# Patient Record
Sex: Female | Born: 1975
Health system: Southern US, Community
[De-identification: ages and names within clinical notes are randomized; demographics above are authoritative.]

## PROBLEM LIST (undated history)

## (undated) DIAGNOSIS — J45909 Unspecified asthma, uncomplicated: Secondary | ICD-10-CM

## (undated) DIAGNOSIS — N189 Chronic kidney disease, unspecified: Secondary | ICD-10-CM

## (undated) DIAGNOSIS — R519 Headache, unspecified: Secondary | ICD-10-CM

## (undated) DIAGNOSIS — K219 Gastro-esophageal reflux disease without esophagitis: Secondary | ICD-10-CM

## (undated) DIAGNOSIS — F419 Anxiety disorder, unspecified: Secondary | ICD-10-CM

## (undated) DIAGNOSIS — J189 Pneumonia, unspecified organism: Secondary | ICD-10-CM

## (undated) DIAGNOSIS — N390 Urinary tract infection, site not specified: Secondary | ICD-10-CM

## (undated) DIAGNOSIS — M0600 Rheumatoid arthritis without rheumatoid factor, unspecified site: Secondary | ICD-10-CM

## (undated) DIAGNOSIS — I471 Supraventricular tachycardia, unspecified: Secondary | ICD-10-CM

## (undated) DIAGNOSIS — T7840XA Allergy, unspecified, initial encounter: Secondary | ICD-10-CM

## (undated) HISTORY — DX: Allergy, unspecified, initial encounter: T78.40XA

## (undated) HISTORY — DX: Rheumatoid arthritis without rheumatoid factor, unspecified site: M06.00

## (undated) HISTORY — DX: Unspecified asthma, uncomplicated: J45.909

## (undated) HISTORY — DX: Gastro-esophageal reflux disease without esophagitis: K21.9

## (undated) HISTORY — PX: HIP ARTHROPLASTY: SHX981

## (undated) HISTORY — DX: Headache, unspecified: R51.9

## (undated) HISTORY — DX: Urinary tract infection, site not specified: N39.0

## (undated) HISTORY — PX: SHOULDER ARTHROSCOPY: SHX128

## (undated) HISTORY — PX: OTHER SURGICAL HISTORY: SHX169

## (undated) HISTORY — PX: TONSILLECTOMY: SUR1361

---

## 2007-07-06 HISTORY — PX: DILATION AND CURETTAGE OF UTERUS: SHX78

## 2009-02-02 HISTORY — PX: INTRAUTERINE DEVICE (IUD) INSERTION: SHX5877

## 2013-09-24 ENCOUNTER — Encounter: Payer: Self-pay | Admitting: Gastroenterology

## 2013-10-29 HISTORY — PX: ESOPHAGOGASTRODUODENOSCOPY: SHX1529

## 2013-11-20 ENCOUNTER — Ambulatory Visit: Payer: Self-pay | Admitting: Gastroenterology

## 2019-02-19 DIAGNOSIS — J45909 Unspecified asthma, uncomplicated: Secondary | ICD-10-CM | POA: Diagnosis not present

## 2019-02-19 DIAGNOSIS — F411 Generalized anxiety disorder: Secondary | ICD-10-CM | POA: Diagnosis not present

## 2019-02-19 DIAGNOSIS — Z6824 Body mass index (BMI) 24.0-24.9, adult: Secondary | ICD-10-CM | POA: Diagnosis not present

## 2019-02-19 DIAGNOSIS — Z1331 Encounter for screening for depression: Secondary | ICD-10-CM | POA: Diagnosis not present

## 2019-02-19 DIAGNOSIS — Z0000 Encounter for general adult medical examination without abnormal findings: Secondary | ICD-10-CM | POA: Diagnosis not present

## 2019-02-19 DIAGNOSIS — M0609 Rheumatoid arthritis without rheumatoid factor, multiple sites: Secondary | ICD-10-CM | POA: Diagnosis not present

## 2019-02-19 DIAGNOSIS — J302 Other seasonal allergic rhinitis: Secondary | ICD-10-CM | POA: Diagnosis not present

## 2019-02-19 DIAGNOSIS — Z1389 Encounter for screening for other disorder: Secondary | ICD-10-CM | POA: Diagnosis not present

## 2019-03-01 ENCOUNTER — Other Ambulatory Visit: Payer: Self-pay | Admitting: Internal Medicine

## 2019-03-02 DIAGNOSIS — M0600 Rheumatoid arthritis without rheumatoid factor, unspecified site: Secondary | ICD-10-CM | POA: Diagnosis not present

## 2019-03-02 DIAGNOSIS — M2550 Pain in unspecified joint: Secondary | ICD-10-CM | POA: Diagnosis not present

## 2019-03-02 DIAGNOSIS — Z79899 Other long term (current) drug therapy: Secondary | ICD-10-CM | POA: Diagnosis not present

## 2019-03-05 ENCOUNTER — Ambulatory Visit (HOSPITAL_BASED_OUTPATIENT_CLINIC_OR_DEPARTMENT_OTHER): Payer: Self-pay | Admitting: Pharmacist

## 2019-03-05 ENCOUNTER — Other Ambulatory Visit: Payer: Self-pay

## 2019-03-05 DIAGNOSIS — Z7189 Other specified counseling: Secondary | ICD-10-CM

## 2019-03-05 MED ORDER — HUMIRA (2 PEN) 40 MG/0.4ML ~~LOC~~ AJKT: 40.0000 mg | AUTO-INJECTOR | SUBCUTANEOUS | 6 refills | Status: DC

## 2019-03-05 NOTE — Progress Notes (Signed)
  S: Patient presents for review of their specialty medication therapy.  Patient is currently taking Humira for RA. Patient is managed by Dr. Gavin Pound for this. She recently switched to [facility] and is still seeing a PCP in [place]. Since switching health systems, she has missed 1 dose of Humira.   Adherence: reports; never misses a dose with exception of last Sunday d/t insurance.  Efficacy: reports good results   Dosing:  Rheumatoid arthritis: SubQ: 40 mg every other week (may continue methotrexate, other nonbiologic DMARDS, corticosteroids, NSAIDs, and/or analgesics); patients not taking concomitant methotrexate may increase dose to 40 mg every week  Dose adjustments: Renal: no dose adjustments (has not been studied) Hepatic: no dose adjustments (has not been studied)  Drug-drug interactions: none  Screening: TB test: reevaluated 08/2018; negative  Hepatitis: negative per patient  Monitoring: S/sx of infection: none  CBC:  Reevaluated 2 weeks ago by PCP ([place] [facility]) - wnl S/sx of hypersensitivity: some redness, urticaria; ~8mm spot that resolves in 2 days  S/sx of malignancy: none  S/sx of heart failure: none   Other side effects: none reported   O:  No results found for: WBC, HGB, HCT, MCV, PLT    Chemistry   No results found for: NA, K, CL, CO2, BUN, CREATININE, GLU No results found for: CALCIUM, ALKPHOS, AST, ALT, BILITOT     A/P: 1. Medication review: Patient currently on Humira for RA; she reports being on this medication previous to this encounter. Reviewed the medication with the patient, including the following: Humira is a TNF blocking agent indicated for ankylosing spondylitis, Crohn's disease, Hidradenitis suppurativa, psoriatic arthritis, plaque psoriasis, ulcerative colitis, and uveitis. Patient educated on purpose, proper use and potential adverse effects of Humira. Possible adverse effects are increased risk of infections, headache, and injection  site reactions. There is the possibility of an increased risk of malignancy but it is not well understood if this increased risk is due to there medication or the disease state. There are rare cases of pancytopenia and aplastic anemia. For SubQ injection at separate sites in the thigh or lower abdomen (avoiding areas within 2 inches of navel); rotate injection sites. May leave at room temperature for ~15 to 30 minutes prior to use; do not remove cap or cover while allowing product to reach room temperature. Do not use if solution is discolored or contains particulate matter. Do not administer to skin which is red, tender, bruised, hard, or that has scars, stretch marks, or psoriasis plaques. Needle cap of the prefilled syringe or needle cover for the adalimumab pen may contain latex. Prefilled pens and syringes are available for use by patients and the full amount of the syringe should be injected (self-administration); the vial is intended for institutional use only. Vials do not contain a preservative; discard unused portion. No recommendations for any changes at this time.  Benard Halsted, PharmD, [place] [phone]

## 2019-03-08 ENCOUNTER — Other Ambulatory Visit: Payer: Self-pay | Admitting: Internal Medicine

## 2019-03-08 DIAGNOSIS — Z1231 Encounter for screening mammogram for malignant neoplasm of breast: Secondary | ICD-10-CM

## 2019-03-16 ENCOUNTER — Encounter: Payer: Self-pay | Admitting: Gastroenterology

## 2019-03-20 MED FILL — HUMIRA PEN 40 MG/0.4ML PNKT: 40 | 28 days supply | Qty: 2 | Fill #0

## 2019-03-20 MED FILL — HYDROXYCHLOROQUINE SULFATE: 200 | 90 days supply | Qty: 180 | Fill #0

## 2019-04-02 ENCOUNTER — Ambulatory Visit: Payer: Self-pay | Admitting: Gastroenterology

## 2019-04-16 MED FILL — HUMIRA PEN 40 MG/0.4ML PNKT: 40 | 28 days supply | Qty: 2 | Fill #1

## 2019-04-16 MED FILL — CELECOXIB 200 MG CAP: 200 | 90 days supply | Qty: 180 | Fill #0

## 2019-04-25 ENCOUNTER — Ambulatory Visit: Payer: Self-pay

## 2019-05-16 MED FILL — HUMIRA PEN 40 MG/0.4ML PNKT: 40 | 28 days supply | Qty: 2 | Fill #2

## 2019-06-12 ENCOUNTER — Ambulatory Visit
Admission: RE | Admit: 2019-06-12 | Discharge: 2019-06-12 | Disposition: A | Discharge status: Home / Self Care | Payer: 59 | Source: Ambulatory Visit | Attending: Internal Medicine | Admitting: Internal Medicine

## 2019-06-12 ENCOUNTER — Other Ambulatory Visit: Payer: Self-pay

## 2019-06-12 DIAGNOSIS — Z1231 Encounter for screening mammogram for malignant neoplasm of breast: Secondary | ICD-10-CM | POA: Diagnosis not present

## 2019-06-12 IMAGING — MG DIGITAL SCREENING BILAT W/ TOMO W/ CAD
6 of 10 series · 6 of 30 positions shown · non-contrast
Comparison: Previous exam(s).

CLINICAL DATA: Screening.

EXAM:
DIGITAL SCREENING BILATERAL MAMMOGRAM WITH TOMO AND CAD

[L CC synth-2D]
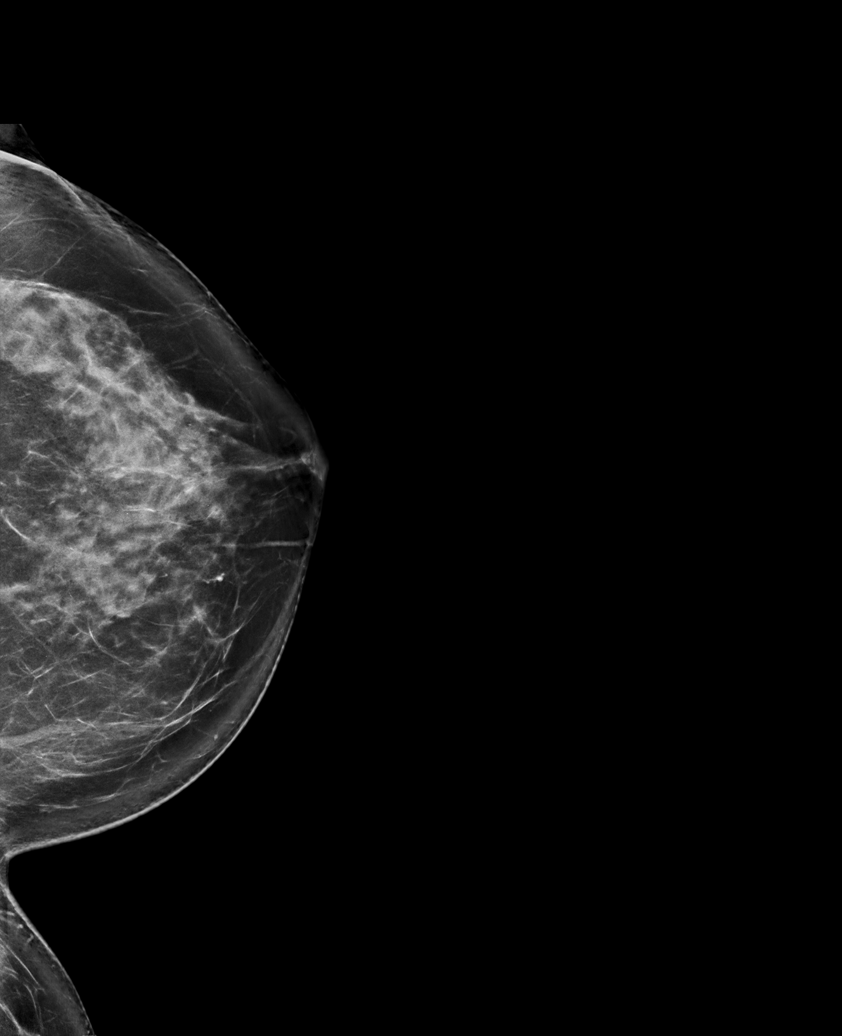

[R CC synth-2D (1 of 2)]
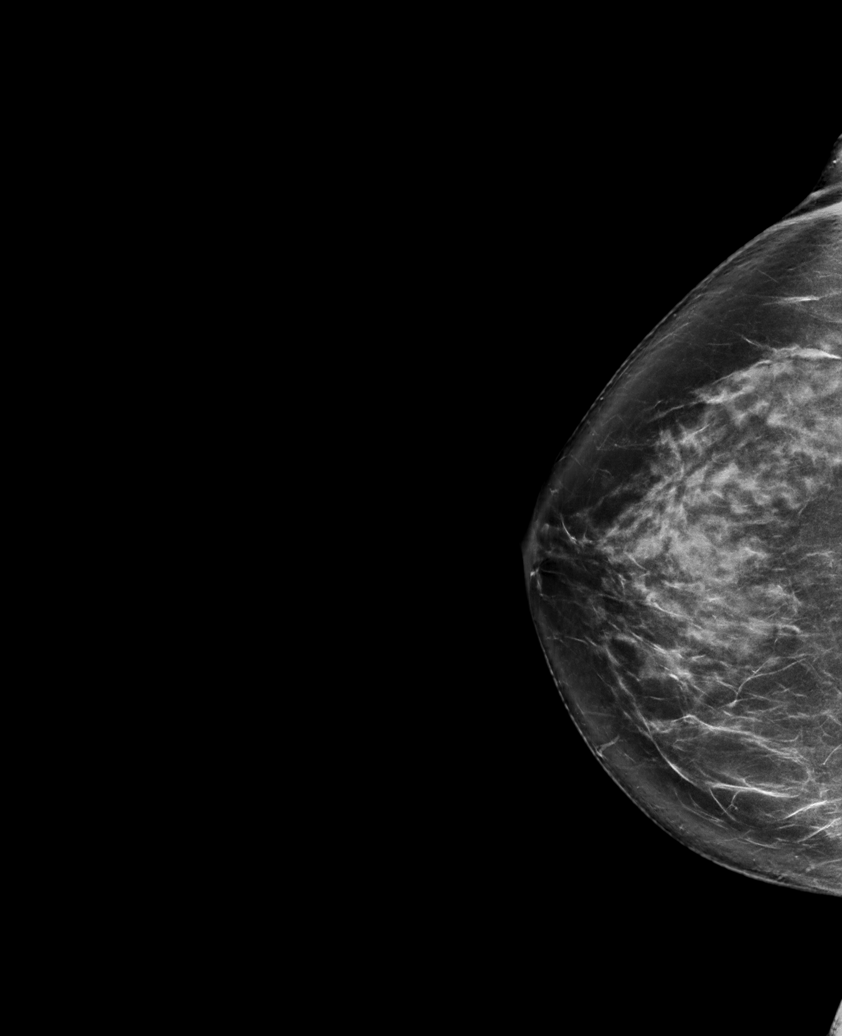

[R MLO synth-2D]
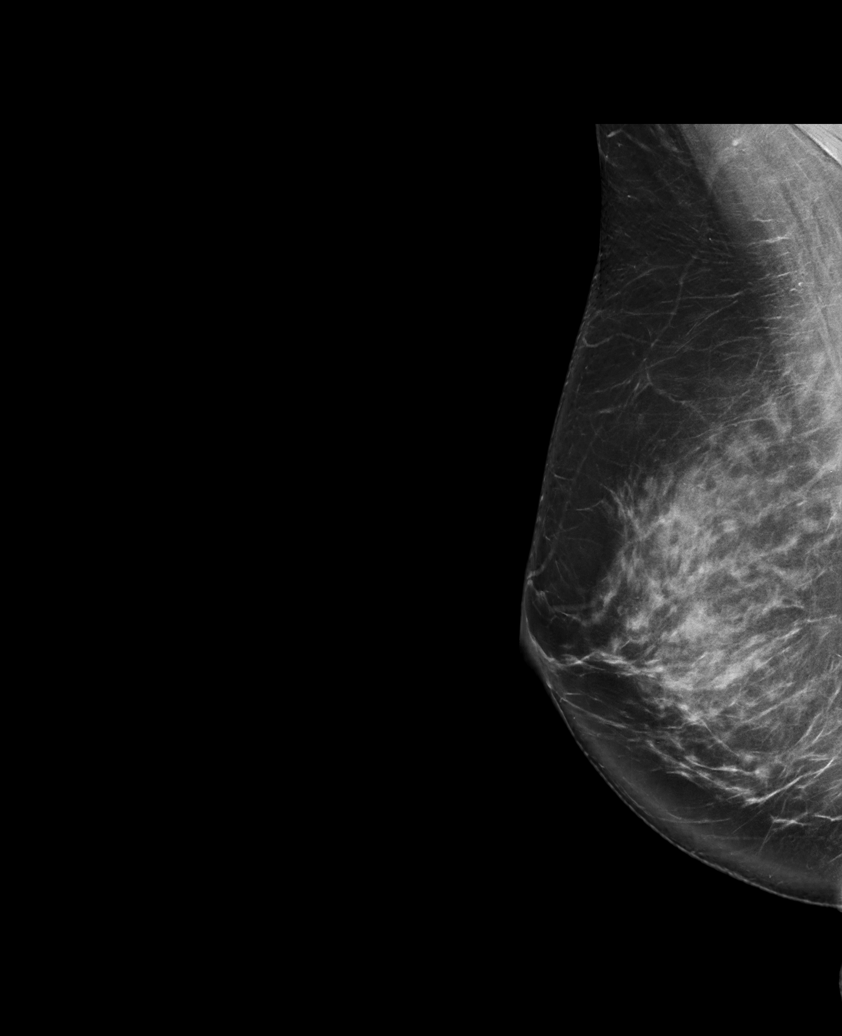

[R CC synth-2D (2 of 2)]
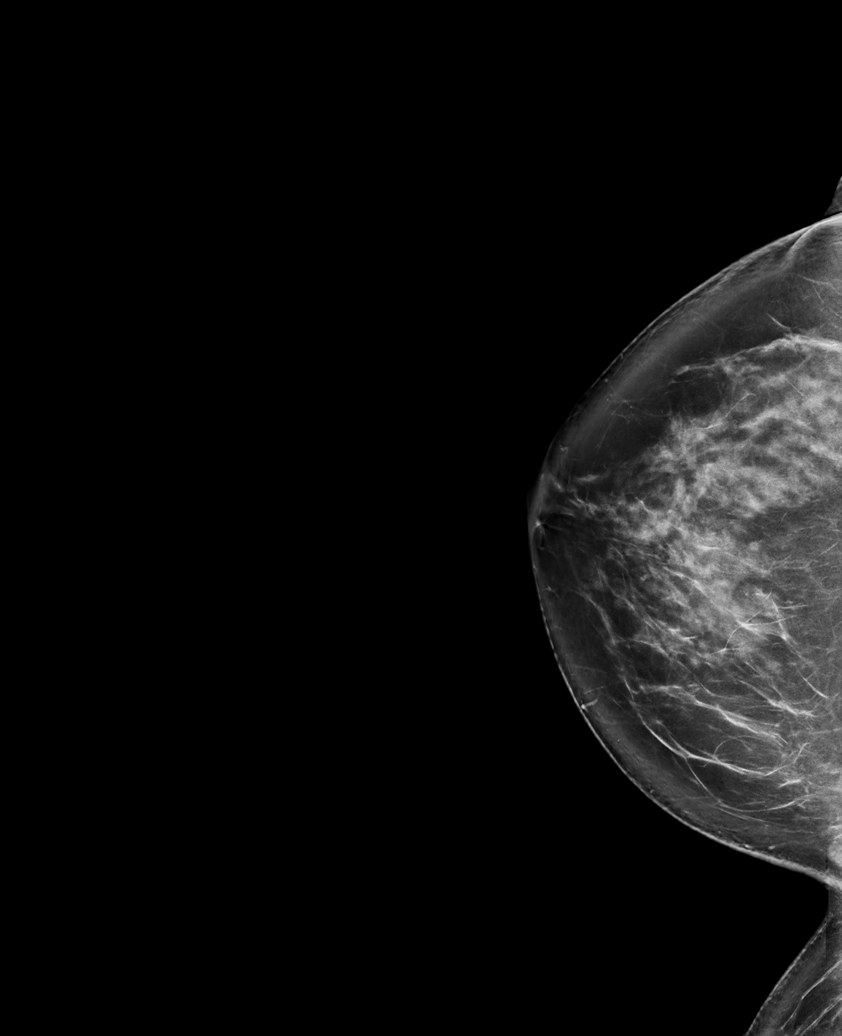

[L MLO synth-2D]
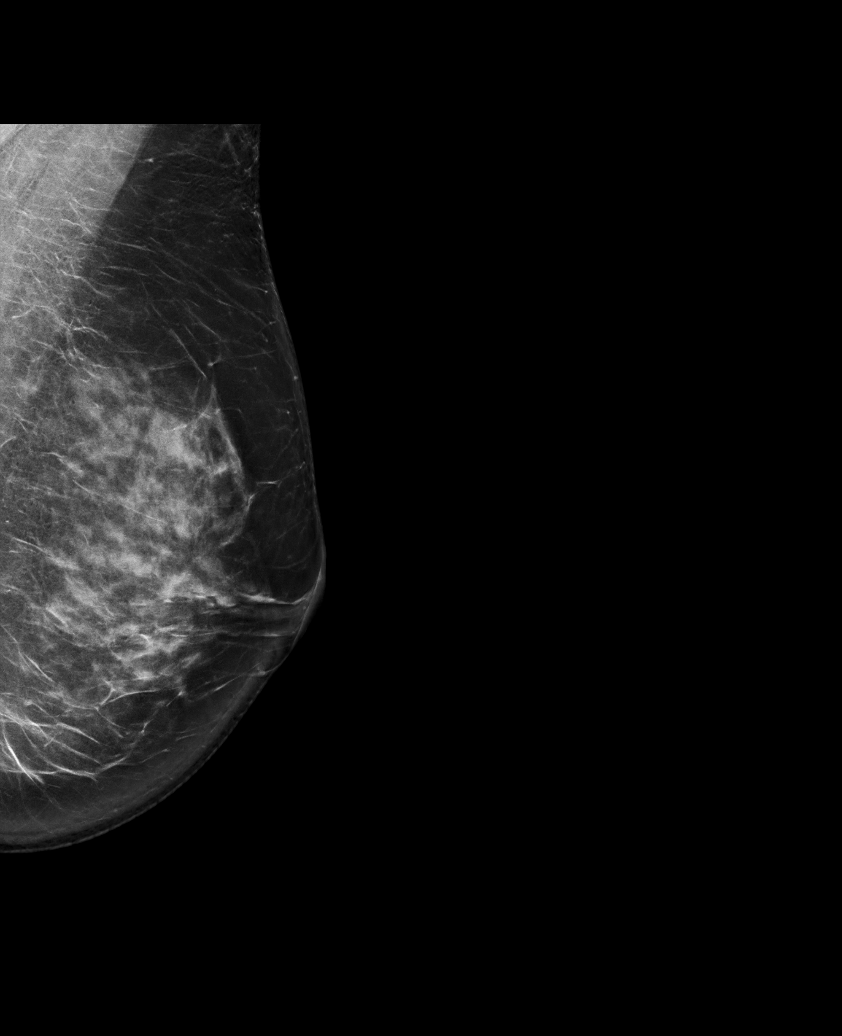

[L MLO tomo · tomo slice 45/88.0]
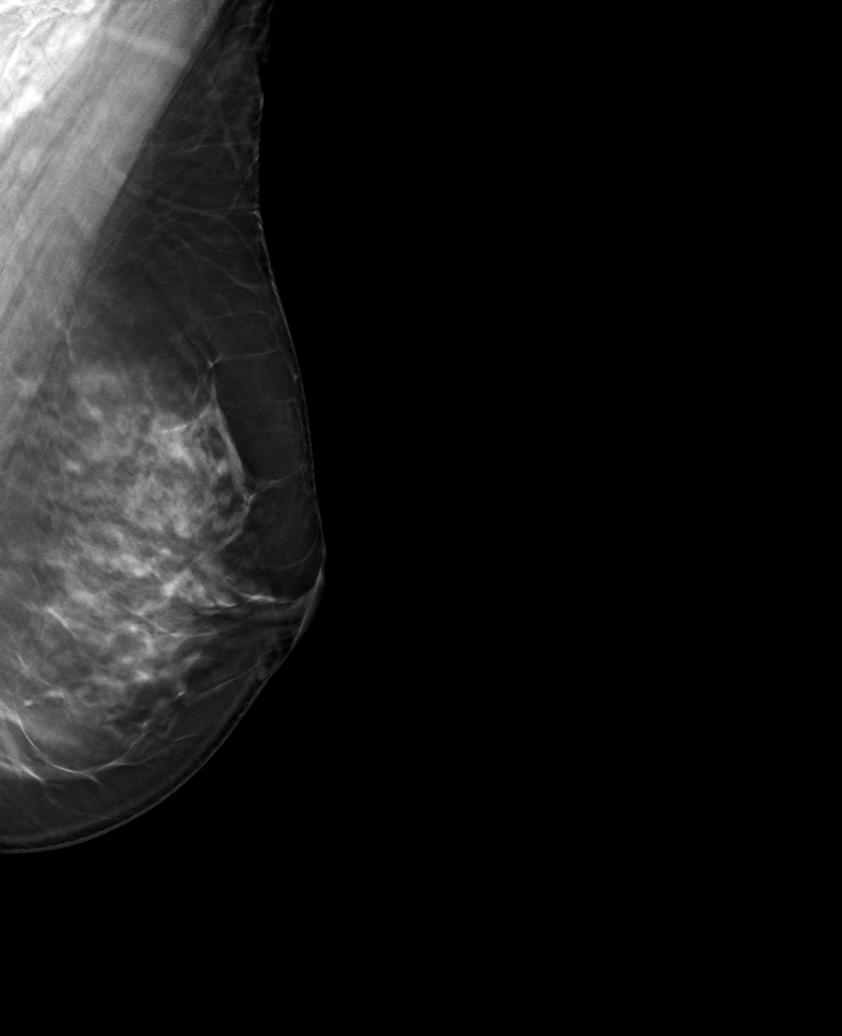

[6 of 30 positions shown; findings below may reference images not displayed]

ACR Breast Density Category d: The breast tissue is extremely dense,
which lowers the sensitivity of mammography
FINDINGS: There are no findings suspicious for malignancy. Images were
processed with CAD.
IMPRESSION: No mammographic evidence of malignancy. A result letter of this
screening mammogram will be mailed directly to the patient.

RECOMMENDATION:
Screening mammogram in one year. (Code:[5I])

BI-RADS CATEGORY  1: Negative.

## 2019-06-18 MED FILL — HUMIRA PEN 40 MG/0.4ML PNKT: 40 | 28 days supply | Qty: 2 | Fill #3

## 2019-07-12 DIAGNOSIS — R002 Palpitations: Secondary | ICD-10-CM | POA: Diagnosis not present

## 2019-07-12 DIAGNOSIS — K219 Gastro-esophageal reflux disease without esophagitis: Secondary | ICD-10-CM | POA: Diagnosis not present

## 2019-07-12 DIAGNOSIS — Z6823 Body mass index (BMI) 23.0-23.9, adult: Secondary | ICD-10-CM | POA: Diagnosis not present

## 2019-07-12 DIAGNOSIS — E559 Vitamin D deficiency, unspecified: Secondary | ICD-10-CM | POA: Diagnosis not present

## 2019-07-12 DIAGNOSIS — Z79899 Other long term (current) drug therapy: Secondary | ICD-10-CM | POA: Diagnosis not present

## 2019-07-12 DIAGNOSIS — N920 Excessive and frequent menstruation with regular cycle: Secondary | ICD-10-CM | POA: Diagnosis not present

## 2019-07-12 DIAGNOSIS — M069 Rheumatoid arthritis, unspecified: Secondary | ICD-10-CM | POA: Diagnosis not present

## 2019-07-16 MED FILL — HUMIRA PEN 40 MG/0.4ML PNKT: 40 | 28 days supply | Qty: 2 | Fill #0

## 2019-07-31 DIAGNOSIS — R002 Palpitations: Secondary | ICD-10-CM | POA: Diagnosis not present

## 2019-07-31 MED FILL — HYDROXYCHLOROQUINE SULFATE: 200 | 90 days supply | Qty: 180 | Fill #1

## 2019-07-31 MED FILL — CELECOXIB 200 MG CAP: 200 | 90 days supply | Qty: 180 | Fill #0

## 2019-08-13 DIAGNOSIS — I471 Supraventricular tachycardia: Secondary | ICD-10-CM | POA: Diagnosis not present

## 2019-08-13 DIAGNOSIS — E559 Vitamin D deficiency, unspecified: Secondary | ICD-10-CM | POA: Diagnosis not present

## 2019-08-13 DIAGNOSIS — Z6822 Body mass index (BMI) 22.0-22.9, adult: Secondary | ICD-10-CM | POA: Diagnosis not present

## 2019-08-14 MED FILL — HUMIRA PEN 40 MG/0.4ML PNKT: 40 | 28 days supply | Qty: 2 | Fill #1

## 2019-08-23 ENCOUNTER — Encounter: Payer: Self-pay | Admitting: Cardiology

## 2019-08-23 DIAGNOSIS — R002 Palpitations: Secondary | ICD-10-CM

## 2019-08-23 HISTORY — DX: Palpitations: R00.2

## 2019-08-30 NOTE — Progress Notes (Signed)
Cardiology Office Note:    Date:  08/31/2019   ID:  Alice Klein, DOB January 03, 1976, MRN [id number]  PCP:  Alice Grammes, MD  Cardiologist:  Alice More, MD   Referring MD: Alice Grammes, MD  ASSESSMENT:    1. Supraventricular tachycardia (HCC)   2. Palpitations   3. Rheumatoid arthritis, involving unspecified site, unspecified whether rheumatoid factor present ([place])    PLAN:    In order of problems listed above:  1. She has documented symptomatic SVT.  She takes no over-the-counter proarrhythmic drugs.  We discussed treatment options we will place her on a low-dose of selective beta-blocker if she has ongoing symptoms refer to EP for catheter ablation.  At this time I do not think she requires cardiac imaging like an echocardiogram. 2. Stable no evidence of QT prolongation she can continue hydroxychloroquine as I am concerned  that interference with treatment can cause exacerbation of her rheumatoid arthritis  Next appointment 3 months   Medication Adjustments/Labs and Tests Ordered: Current medicines are reviewed at length with the patient today.  Concerns regarding medicines are outlined above.  Orders Placed This Encounter  Procedures  . EKG 12-Lead   Meds ordered this encounter  Medications  . metoprolol succinate (TOPROL XL) 25 MG 24 hr tablet    Sig: Take 1 tablet (25 mg total) by mouth daily.    Dispense:  90 tablet    Refill:  1     Chief Complaint  Patient presents with  . Palpitations    History of Present Illness:    Alice Klein is a 44 y.o. female who is being seen today for the evaluation of palpitation with rapid heart rate at the request of Alice Grammes, MD.  Reviewed the records of her primary care physician ,she had lab work drawn and an EKG looks like she had an event monitor and I do not have copies of those records.  She has a long history of intermittent rapid heartbeat but never like to smoke.  In the last 3 months she  has multiple episodes a week have lasted up to 6 hours heart rate of 135 to 145 bpm which makes her feel short of breath and weak.Marland Kitchen  She will resume monitoring tells me she has documented SVT.  She has no known heart disease heart murmur rheumatic or congenital.  Otherwise no exertional chest pain shortness of breath or syncope.  She takes no over-the-counter proarrhythmic drugs and recent labs including hemoglobin thyroid normal  Remain in the office until is able to obtain her heart rhythm monitor.  She worked for 10 days 10 hours.  4 episodes of SVT occurred in a relatively slow rate of 130-148 bpm.  Longest episode lasted 8 minutes and 13 seconds. Past Medical History:  Diagnosis Date  . Allergies   . GERD (gastroesophageal reflux disease)   . Headache   . Palpitations 08/23/2019  . Reactive airway disease   . Seronegative rheumatoid arthritis ([place])   . UTI (urinary tract infection)     Past Surgical History:  Procedure Laterality Date  . ESOPHAGOGASTRODUODENOSCOPY  10/29/2013   Normal EGD  . INTRAUTERINE DEVICE (IUD) INSERTION  02/2009  . OTHER SURGICAL HISTORY     Ureteral Surgery   . SHOULDER ARTHROSCOPY Right   . TONSILLECTOMY      Current Medications: Current Meds  Medication Sig  . Adalimumab (HUMIRA PEN) 40 MG/0.4ML PNKT Inject 40 mg into the skin every 14 (fourteen) days.  . ALPRAZolam (  XANAX) 0.5 MG tablet Take 0.5 mg by mouth at bedtime as needed for anxiety.  Marland Kitchen CALCIUM-VITAMIN D PO Take by mouth.  . celecoxib (CELEBREX) 200 MG capsule   . cyclobenzaprine (FLEXERIL) 10 MG tablet Take 10 mg by mouth 3 (three) times daily as needed for muscle spasms.  . hydroxychloroquine (PLAQUENIL) 200 MG tablet Take 500 mg by mouth 2 (two) times daily.  . [DISCONTINUED] hydroxychloroquine (PLAQUENIL) 200 MG tablet      Allergies:   Clarithromycin, Penicillin g, and Latex   Social History   Socioeconomic History  . Marital status: Married    Spouse name: Not on file  . Number  of children: Not on file  . Years of education: Not on file  . Highest education level: Not on file  Occupational History  . Not on file  Tobacco Use  . Smoking status: Former Research scientist (life sciences)  . Smokeless tobacco: Never Used  Substance and Sexual Activity  . Alcohol use: Yes    Alcohol/week: 3.0 standard drinks    Types: 3 Glasses of wine per week  . Drug use: Never  . Sexual activity: Yes  Other Topics Concern  . Not on file  Social History Narrative  . Not on file   Social Determinants of Health   Financial Resource Strain:   . Difficulty of Paying Living Expenses: Not on file  Food Insecurity:   . Worried About Charity fundraiser in the Last Year: Not on file  . Ran Out of Food in the Last Year: Not on file  Transportation Needs:   . Lack of Transportation (Medical): Not on file  . Lack of Transportation (Non-Medical): Not on file  Physical Activity:   . Days of Exercise per Week: Not on file  . Minutes of Exercise per Session: Not on file  Stress:   . Feeling of Stress : Not on file  Social Connections:   . Frequency of Communication with Friends and Family: Not on file  . Frequency of Social Gatherings with Friends and Family: Not on file  . Attends Religious Services: Not on file  . Active Member of Clubs or Organizations: Not on file  . Attends Archivist Meetings: Not on file  . Marital Status: Not on file     Family History: The patient's family history includes Colon polyps in her mother; Hypertension in her father and mother; Stroke in her brother. There is no history of Colon cancer.  ROS:   Review of Systems  Constitution: Negative.  HENT: Negative.   Eyes: Negative.   Cardiovascular: Positive for palpitations.  Respiratory: Positive for shortness of breath (with SVT).   Endocrine: Negative.   Hematologic/Lymphatic: Negative.   Skin: Negative.   Musculoskeletal: Positive for joint pain.  Gastrointestinal: Negative.   Genitourinary: Negative.    Neurological: Negative.   Psychiatric/Behavioral: Negative.   Allergic/Immunologic: Negative.    Please see the history of present illness.     All other systems reviewed and are negative.  EKGs/Labs/Other Studies Reviewed:    The following studies were reviewed today:   EKG:  EKG is  ordered today.  The ekg ordered today is personally reviewed and demonstrates sinus rhythm and is normal there are no delta waves QT interval is normal  Recent Labs: No results found for requested labs within last 8760 hours.  Recent Lipid Panel No results found for: CHOL, TRIG, HDL, CHOLHDL, VLDL, LDLCALC, LDLDIRECT  Physical Exam:    VS:  BP 110/74  Pulse 86   Temp (!) 96.8 F (36 C)   Ht 5\' 9"  (1.753 m)   Wt 162 lb (73.5 kg)   SpO2 97%   BMI 23.92 kg/m     Wt Readings from Last 3 Encounters:  08/31/19 162 lb (73.5 kg)     GEN:  Well nourished, well developed in no acute distress HEENT: Normal NECK: No JVD; No carotid bruits LYMPHATICS: No lymphadenopathy CARDIAC: RRR, no murmurs, rubs, gallops RESPIRATORY:  Clear to auscultation without rales, wheezing or rhonchi  ABDOMEN: Soft, non-tender, non-distended MUSCULOSKELETAL:  No edema; No deformity  SKIN: Warm and dry NEUROLOGIC:  Alert and oriented x 3 PSYCHIATRIC:  Normal affect     Signed, Alice More, MD  08/31/2019 5:09 PM    [facility]

## 2019-08-31 ENCOUNTER — Other Ambulatory Visit: Payer: Self-pay

## 2019-08-31 ENCOUNTER — Encounter: Payer: Self-pay | Admitting: Cardiology

## 2019-08-31 ENCOUNTER — Ambulatory Visit (INDEPENDENT_AMBULATORY_CARE_PROVIDER_SITE_OTHER): Payer: 59 | Admitting: Cardiology

## 2019-08-31 VITALS — BP 110/74 | HR 86 | Temp 96.8°F | Ht 69.0 in | Wt 162.0 lb

## 2019-08-31 DIAGNOSIS — R002 Palpitations: Secondary | ICD-10-CM | POA: Diagnosis not present

## 2019-08-31 DIAGNOSIS — I471 Supraventricular tachycardia: Secondary | ICD-10-CM | POA: Diagnosis not present

## 2019-08-31 DIAGNOSIS — M069 Rheumatoid arthritis, unspecified: Secondary | ICD-10-CM

## 2019-08-31 MED ORDER — METOPROLOL SUCCINATE ER 25 MG PO TB24: 25.0000 mg | ORAL_TABLET | Freq: Every day | ORAL | 1 refills | Status: DC

## 2019-08-31 MED FILL — METOPROLOL SUCCINATE ER 25: 25 | 90 days supply | Qty: 90 | Fill #0

## 2019-08-31 NOTE — Patient Instructions (Signed)
Medication Instructions:  Your physician has recommended you make the following change in your medication:   START: Toprol XL 25 mg daily.   *If you need a refill on your cardiac medications before your next appointment, please call your pharmacy*   Lab Work: None If you have labs (blood work) drawn today and your tests are completely normal, you will receive your results only by: Marland Kitchen MyChart Message (if you have MyChart) OR . A paper copy in the mail If you have any lab test that is abnormal or we need to change your treatment, we will call you to review the results.   Testing/Procedures: None   Follow-Up: At [facility], you and your health needs are our priority.  As part of our continuing mission to provide you with exceptional heart care, we have created designated Provider Care Teams.  These Care Teams include your primary Cardiologist (physician) and Advanced Practice Providers (APPs -  Physician Assistants and Nurse Practitioners) who all work together to provide you with the care you need, when you need it.  We recommend signing up for the patient portal called "MyChart".  Sign up information is provided on this After Visit Summary.  MyChart is used to connect with patients for Virtual Visits (Telemedicine).  Patients are able to view lab/test results, encounter notes, upcoming appointments, etc.  Non-urgent messages can be sent to your provider as well.   To learn more about what you can do with MyChart, go to [email or web address].    Your next appointment:   3 month(s)  The format for your next appointment:   In Person  Provider:   Shirlee More, MD   Other Instructions

## 2019-09-20 DIAGNOSIS — M2550 Pain in unspecified joint: Secondary | ICD-10-CM | POA: Diagnosis not present

## 2019-09-20 DIAGNOSIS — Z6823 Body mass index (BMI) 23.0-23.9, adult: Secondary | ICD-10-CM | POA: Diagnosis not present

## 2019-09-20 DIAGNOSIS — M0600 Rheumatoid arthritis without rheumatoid factor, unspecified site: Secondary | ICD-10-CM | POA: Diagnosis not present

## 2019-09-20 DIAGNOSIS — Z79899 Other long term (current) drug therapy: Secondary | ICD-10-CM | POA: Diagnosis not present

## 2019-10-09 ENCOUNTER — Other Ambulatory Visit: Payer: Self-pay | Admitting: Pharmacist

## 2019-10-09 MED ORDER — HUMIRA PEN 40 MG/0.8ML ~~LOC~~ PNKT: 40.0000 mg | PEN_INJECTOR | SUBCUTANEOUS | 5 refills | Status: DC

## 2019-10-09 MED ORDER — HUMIRA (2 PEN) 40 MG/0.4ML ~~LOC~~ AJKT: 40.0000 mg | AUTO-INJECTOR | SUBCUTANEOUS | 5 refills | Status: DC

## 2019-10-10 MED FILL — HUMIRA PEN 40 MG/0.8ML PNKT: 40 | 28 days supply | Qty: 2 | Fill #0

## 2019-10-11 DIAGNOSIS — S79911A Unspecified injury of right hip, initial encounter: Secondary | ICD-10-CM | POA: Diagnosis not present

## 2019-10-11 DIAGNOSIS — Z6823 Body mass index (BMI) 23.0-23.9, adult: Secondary | ICD-10-CM | POA: Diagnosis not present

## 2019-11-22 ENCOUNTER — Other Ambulatory Visit: Payer: Self-pay | Admitting: Pharmacist

## 2019-11-22 DIAGNOSIS — M069 Rheumatoid arthritis, unspecified: Secondary | ICD-10-CM | POA: Diagnosis not present

## 2019-11-22 DIAGNOSIS — K219 Gastro-esophageal reflux disease without esophagitis: Secondary | ICD-10-CM | POA: Diagnosis not present

## 2019-11-22 DIAGNOSIS — Z0000 Encounter for general adult medical examination without abnormal findings: Secondary | ICD-10-CM | POA: Diagnosis not present

## 2019-11-22 MED ORDER — HUMIRA (2 PEN) 40 MG/0.4ML ~~LOC~~ AJKT: AUTO-INJECTOR | SUBCUTANEOUS | 6 refills | Status: DC

## 2019-11-27 ENCOUNTER — Other Ambulatory Visit: Payer: Self-pay

## 2019-11-27 NOTE — Progress Notes (Signed)
Cardiology Office Note:    Date:  11/28/2019   ID:  Alice Klein, DOB 03-Jan-1976, MRN [id number]  PCP:  Serita Grammes, MD  Cardiologist:  Shirlee More, MD    Referring MD: Serita Grammes, MD    ASSESSMENT:    1. Supraventricular tachycardia ([place])    PLAN:    In order of problems listed above:  1. Stable no severe prolonged episodes continue beta-blocker follow-up in 6 months if symptoms worsen consider alternatives of antiarrhythmic drug or EP consultation invasive procedure 2. Stable rheumatoid arthritis mentors no connection between her RA drugs and SVT   Next appointment: 6 months   Medication Adjustments/Labs and Tests Ordered: Current medicines are reviewed at length with the patient today.  Concerns regarding medicines are outlined above.  No orders of the defined types were placed in this encounter.  No orders of the defined types were placed in this encounter.   Chief Complaint  Patient presents with  . Follow-up    3 MO FU     History of Present Illness:    Alice Klein is a 44 y.o. female with a hx of SVT, palpitation with rapid heart rate last seen 08/31/2019. Compliance with diet, lifestyle and medications: Yes  She is quite careful avoids over-the-counter proarrhythmic drugs.  She has episodes of brief rapid heart rhythm and the rates are in the range of 135 not severe sustained and after discussion treatment options were made continue low-dose beta-blocker.  If the symptoms are more life disrupted either referral to EP for invasive procedure the addition of a low-dose of antiarrhythmic drug like flecainide would be appropriate.  No chest pain edema shortness of breath or syncope Past Medical History:  Diagnosis Date  . Allergies   . GERD (gastroesophageal reflux disease)   . Headache   . Palpitations 08/23/2019  . Reactive airway disease   . Seronegative rheumatoid arthritis ([place])   . UTI (urinary tract infection)     Past  Surgical History:  Procedure Laterality Date  . ESOPHAGOGASTRODUODENOSCOPY  10/29/2013   Normal EGD  . INTRAUTERINE DEVICE (IUD) INSERTION  02/2009  . OTHER SURGICAL HISTORY     Ureteral Surgery   . SHOULDER ARTHROSCOPY Right   . TONSILLECTOMY      Current Medications: Current Meds  Medication Sig  . Adalimumab (HUMIRA PEN) 40 MG/0.4ML PNKT Inject 40 mg every other week subcutaneous.  . ALPRAZolam (XANAX) 0.5 MG tablet Take 0.5 mg by mouth at bedtime as needed for anxiety.  Marland Kitchen CALCIUM-VITAMIN D PO Take by mouth daily.   . celecoxib (CELEBREX) 200 MG capsule Take 200 mg by mouth 2 (two) times daily.   . cyclobenzaprine (FLEXERIL) 10 MG tablet Take 10 mg by mouth as needed for muscle spasms.   . hydroxychloroquine (PLAQUENIL) 200 MG tablet Take 500 mg by mouth 2 (two) times daily.  . metoprolol succinate (TOPROL XL) 25 MG 24 hr tablet Take 1 tablet (25 mg total) by mouth daily.     Allergies:   Clarithromycin, Penicillin g, and Latex   Social History   Socioeconomic History  . Marital status: Married    Spouse name: Not on file  . Number of children: Not on file  . Years of education: Not on file  . Highest education level: Not on file  Occupational History  . Not on file  Tobacco Use  . Smoking status: Former Smoker    Quit date: 2004    Years since quitting: 17.4  .  Smokeless tobacco: Never Used  Substance and Sexual Activity  . Alcohol use: Yes    Alcohol/week: 3.0 standard drinks    Types: 3 Glasses of wine per week  . Drug use: Never  . Sexual activity: Yes  Other Topics Concern  . Not on file  Social History Narrative  . Not on file   Social Determinants of Health   Financial Resource Strain:   . Difficulty of Paying Living Expenses:   Food Insecurity:   . Worried About Charity fundraiser in the Last Year:   . Arboriculturist in the Last Year:   Transportation Needs:   . Film/video editor (Medical):   Marland Kitchen Lack of Transportation (Non-Medical):     Physical Activity:   . Days of Exercise per Week:   . Minutes of Exercise per Session:   Stress:   . Feeling of Stress :   Social Connections:   . Frequency of Communication with Friends and Family:   . Frequency of Social Gatherings with Friends and Family:   . Attends Religious Services:   . Active Member of Clubs or Organizations:   . Attends Archivist Meetings:   Marland Kitchen Marital Status:      Family History: The patient's family history includes Colon polyps in her mother; Hypertension in her father and mother; Stroke in her brother. There is no history of Colon cancer. ROS:   Please see the history of present illness.    All other systems reviewed and are negative.  EKGs/Labs/Other Studies Reviewed:    The following studies were reviewed today:    Recent Labs: No results found for requested labs within last 8760 hours.  Recent Lipid Panel No results found for: CHOL, TRIG, HDL, CHOLHDL, VLDL, LDLCALC, LDLDIRECT  Physical Exam:    VS:  BP 118/76   Pulse 66   Ht 5\' 9"  (1.753 m)   Wt 161 lb 3.2 oz (73.1 kg)   SpO2 98%   BMI 23.81 kg/m     Wt Readings from Last 3 Encounters:  11/28/19 161 lb 3.2 oz (73.1 kg)  08/31/19 162 lb (73.5 kg)     GEN:  Well nourished, well developed in no acute distress HEENT: Normal NECK: No JVD; No carotid bruits LYMPHATICS: No lymphadenopathy CARDIAC: RRR, no murmurs, rubs, gallops RESPIRATORY:  Clear to auscultation without rales, wheezing or rhonchi  ABDOMEN: Soft, non-tender, non-distended MUSCULOSKELETAL:  No edema; No deformity  SKIN: Warm and dry NEUROLOGIC:  Alert and oriented x 3 PSYCHIATRIC:  Normal affect    Signed, Shirlee More, MD  11/28/2019 1:39 PM    [facility]

## 2019-11-28 ENCOUNTER — Encounter: Payer: Self-pay | Admitting: Cardiology

## 2019-11-28 ENCOUNTER — Other Ambulatory Visit: Payer: Self-pay

## 2019-11-28 ENCOUNTER — Ambulatory Visit (INDEPENDENT_AMBULATORY_CARE_PROVIDER_SITE_OTHER): Payer: 59 | Admitting: Cardiology

## 2019-11-28 VITALS — BP 118/76 | HR 66 | Ht 69.0 in | Wt 161.2 lb

## 2019-11-28 DIAGNOSIS — I471 Supraventricular tachycardia: Secondary | ICD-10-CM

## 2019-11-28 NOTE — Patient Instructions (Signed)

## 2019-11-29 MED FILL — HUMIRA PEN 40 MG/0.4ML PNKT: 40 | 28 days supply | Qty: 2 | Fill #0

## 2019-12-04 MED FILL — METOPROLOL SUCCINATE ER 25: 25 | 90 days supply | Qty: 90 | Fill #0

## 2019-12-21 MED FILL — HYDROXYCHLOROQUINE SULFATE: 200 | 90 days supply | Qty: 180 | Fill #0

## 2019-12-27 MED FILL — HUMIRA PEN 40 MG/0.4ML PNKT: 40 | 28 days supply | Qty: 2 | Fill #1

## 2020-01-21 MED FILL — HUMIRA PEN 40 MG/0.4ML PNKT: 40 | 28 days supply | Qty: 2 | Fill #2

## 2020-02-26 MED FILL — HUMIRA PEN 40 MG/0.4ML PNKT: 40 | 28 days supply | Qty: 2 | Fill #3

## 2020-03-17 MED FILL — CELECOXIB 200 MG CAP: 200 | 90 days supply | Qty: 180 | Fill #0

## 2020-03-27 DIAGNOSIS — Z79899 Other long term (current) drug therapy: Secondary | ICD-10-CM | POA: Diagnosis not present

## 2020-03-27 DIAGNOSIS — Z6824 Body mass index (BMI) 24.0-24.9, adult: Secondary | ICD-10-CM | POA: Diagnosis not present

## 2020-03-27 DIAGNOSIS — M0600 Rheumatoid arthritis without rheumatoid factor, unspecified site: Secondary | ICD-10-CM | POA: Diagnosis not present

## 2020-03-27 DIAGNOSIS — M2550 Pain in unspecified joint: Secondary | ICD-10-CM | POA: Diagnosis not present

## 2020-03-27 MED FILL — traMADol HCL 50 MG TABS: 50 | 20 days supply | Qty: 20 | Fill #0

## 2020-04-01 ENCOUNTER — Ambulatory Visit: Payer: 59 | Admitting: Orthopaedic Surgery

## 2020-04-01 MED FILL — HUMIRA PEN 40 MG/0.4ML PNKT: 40 | 28 days supply | Qty: 2 | Fill #4

## 2020-04-02 ENCOUNTER — Ambulatory Visit: Payer: 59 | Admitting: Orthopaedic Surgery

## 2020-04-02 ENCOUNTER — Ambulatory Visit: Payer: Self-pay

## 2020-04-02 ENCOUNTER — Encounter: Payer: Self-pay | Admitting: Orthopaedic Surgery

## 2020-04-02 DIAGNOSIS — M25552 Pain in left hip: Secondary | ICD-10-CM

## 2020-04-02 NOTE — Progress Notes (Signed)
Office Visit Note   Patient: Alice Klein           Date of Birth: [birth year]           MRN: [id number] Visit Date: 04/02/2020              Requested by: Serita Grammes, MD [address] [place],  [place] [address] PCP: Serita Grammes, MD   Assessment & Plan: Visit Diagnoses:  1. Pain in left hip     Plan: Discussed options with the patient at this point in time and these would be either surgical intervention being a total hip arthroplasty versus an intra-articular injection.  Patient states that the pain really is affecting her quality of life and she wants to be active.  She is thinking about undergoing a total hip arthroplasty.  Therefore left total hip arthroplasty surgery is discussed with her at length by Dr. Ninfa Linden and myself.  Risk benefits of surgery discussed.  Risk include but are not limited to nerve vessel injury, worsening pain, infection, DVT PE and prolonged pain.  Handout on left total hip arthroplasty surgery was given to the patient.  Postoperative protocol was discussed with the patient.  Samella Parr card is given if she wants to schedule surgery.  She would like to think about scheduling surgery after talking with her husband.  She will follow-up as needed or 2 weeks postop.  Follow-Up Instructions: Return if symptoms worsen or fail to improve.   Orders:  Orders Placed This Encounter  Procedures  . XR HIP UNILAT W OR W/O PELVIS 1V LEFT   No orders of the defined types were placed in this encounter.     Procedures: No procedures performed   Clinical Data: No additional findings.   Subjective: Chief Complaint  Patient presents with  . Left Hip - Pain    HPI Patient is a 43 year old female were seen for the first time for chronic left hip pain.  She brings with her notes from Dr. Aretha Parrot who performed an arthroscopy of her left hip in May 2018.  She underwent a left hip arthroscopy with synovectomy, labral tear chondroplasty of the  acetabulum, microfracture femoral head and femoroplasty.  She states she is done well until few months ago living with some discomfort in the hip but over the last few months.  She is having increased pain and loss of range of motion.  She states she has to sit slightly relaxed that she cannot sit straight up and down due to the pain.  She notes she has to kick her leg out to the side when sitting.  She has had no new injury.  At the time of her arthroscopy she had grade 4 changes of the femoral head and grade 4 changes involving the acetabulum.  She notes she had no complications during the operative or perioperative.  Following the arthroscopy.  She notes no DVTs or PEs. She notes that the pain in her hip is keeping her from doing activities she would like to do like exercising, right motorcycle and riding horses.  She does not use any type of assistive device to ambulate.  She does note that if she works out hard that the next day she she has difficulty ambulating.  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 Patient has rheumatoid arthritis and is on Humira and Plaquenil for this.  She also takes Celebrex for  the pain.  She is seen by Dr. Trudie Reed at [facility] rheumatology.  Otherwise medically healthy. Review of Systems Denies any fevers, chills, shortness of breath, chest pain or any other ongoing infections.  Objective: Vital Signs: There were no vitals taken for this visit.  Physical Exam Constitutional:      Appearance: She is not ill-appearing or diaphoretic.  Pulmonary:     Effort: Pulmonary effort is normal.  Neurological:     Mental Status: She is alert and oriented to person, place, and time.  Psychiatric:        Mood and Affect: Mood normal.        Behavior: Behavior normal.     Ortho Exam Bilateral hips overall good range of motion both hips.  Extremes of internal and external rotation of the left hip  causes pain. Specialty Comments:  No specialty comments available.  Imaging: XR HIP UNILAT W OR W/O PELVIS 1V LEFT  Result Date: 04/02/2020 AP pelvis lateral view left hip: Shows no acute fractures.  Slight flattening of the left femoral head compared to the right femoral head.  Both hips are well located.  Pincer type of osteophyte off the acetabulum superiorly is seen left hip.    PMFS History: Patient Active Problem List   Diagnosis Date Noted  . Palpitations 08/23/2019   Past Medical History:  Diagnosis Date  . Allergies   . GERD (gastroesophageal reflux disease)   . Headache   . Palpitations 08/23/2019  . Reactive airway disease   . Seronegative rheumatoid arthritis ([place])   . UTI (urinary tract infection)     Family History  Problem Relation Age of Onset  . Colon polyps Mother   . Hypertension Mother   . Hypertension Father   . Stroke Brother   . Colon cancer Neg Hx     Past Surgical History:  Procedure Laterality Date  . ESOPHAGOGASTRODUODENOSCOPY  10/29/2013   Normal EGD  . INTRAUTERINE DEVICE (IUD) INSERTION  02/2009  . OTHER SURGICAL HISTORY     Ureteral Surgery   . SHOULDER ARTHROSCOPY Right   . TONSILLECTOMY     Social History   Occupational History  . Not on file  Tobacco Use  . Smoking status: Former Smoker    Quit date: 2004    Years since quitting: 17.7  . Smokeless tobacco: Never Used  Vaping Use  . Vaping Use: Never used  Substance and Sexual Activity  . Alcohol use: Yes    Alcohol/week: 3.0 standard drinks    Types: 3 Glasses of wine per week  . Drug use: Never  . Sexual activity: Yes

## 2020-04-28 ENCOUNTER — Telehealth: Payer: Self-pay | Admitting: Pharmacist

## 2020-04-28 ENCOUNTER — Ambulatory Visit (HOSPITAL_BASED_OUTPATIENT_CLINIC_OR_DEPARTMENT_OTHER): Payer: 59 | Admitting: Pharmacist

## 2020-04-28 ENCOUNTER — Other Ambulatory Visit: Payer: Self-pay

## 2020-04-28 ENCOUNTER — Encounter: Payer: Self-pay | Admitting: Pharmacist

## 2020-04-28 DIAGNOSIS — Z7189 Other specified counseling: Secondary | ICD-10-CM

## 2020-04-28 NOTE — Telephone Encounter (Signed)
Called patient to schedule an appointment for the [facility]. I was unable to reach the patient so I left a HIPAA-compliant message requesting that the patient return my call.   

## 2020-04-28 NOTE — Progress Notes (Signed)
  S: Patient presents for review of their specialty medication therapy.  Patient is currently taking Humira for RA. Patient is managed by Dr. Gavin Pound for this.   Adherence: reported; recently increased to weekly dosing (~3 doses already)   Efficacy: reports that it is going okay with Humira   Dosing:  Rheumatoid arthritis: SubQ: 40 mg every week   Dose adjustments: Renal: no dose adjustments (has not been studied) Hepatic: no dose adjustments (has not been studied)  Drug-drug interactions: none  Screening: TB test: reevaluated 08/2018; negative  Hepatitis: negative per patient  Monitoring: S/sx of infection: none  CBC:  Monitored by her PCP  S/sx of hypersensitivity/injection site reactions: reports reaction with citrate-containing preparation; insurance has now improved CF product   S/sx of malignancy: none  S/sx of heart failure: none   Other side effects: none reported   O:  No results found for: WBC, HGB, HCT, MCV, PLT    Chemistry   No results found for: NA, K, CL, CO2, BUN, CREATININE, GLU No results found for: CALCIUM, ALKPHOS, AST, ALT, BILITOT     A/P: 1. Medication review: Patient currently on Humira for RA. Reviewed the medication with the patient, including the following: Humira is a TNF blocking agent indicated for ankylosing spondylitis, Crohn's disease, Hidradenitis suppurativa, psoriatic arthritis, plaque psoriasis, ulcerative colitis, and uveitis. Patient educated on purpose, proper use and potential adverse effects of Humira. Possible adverse effects are increased risk of infections, headache, and injection site reactions. There is the possibility of an increased risk of malignancy but it is not well understood if this increased risk is due to there medication or the disease state. There are rare cases of pancytopenia and aplastic anemia. For SubQ injection at separate sites in the thigh or lower abdomen (avoiding areas within 2 inches of navel);  rotate injection sites. May leave at room temperature for ~15 to 30 minutes prior to use; do not remove cap or cover while allowing product to reach room temperature. Do not use if solution is discolored or contains particulate matter. Do not administer to skin which is red, tender, bruised, hard, or that has scars, stretch marks, or psoriasis plaques. Needle cap of the prefilled syringe or needle cover for the adalimumab pen may contain latex. Prefilled pens and syringes are available for use by patients and the full amount of the syringe should be injected (self-administration); the vial is intended for institutional use only. Vials do not contain a preservative; discard unused portion. No recommendations for any changes at this time.  Benard Halsted, PharmD, [place] [phone]

## 2020-04-29 ENCOUNTER — Other Ambulatory Visit: Payer: Self-pay | Admitting: Pharmacist

## 2020-04-29 MED ORDER — HUMIRA (2 PEN) 40 MG/0.4ML ~~LOC~~ AJKT: AUTO-INJECTOR | SUBCUTANEOUS | 6 refills | Status: DC

## 2020-05-01 MED FILL — HUMIRA PEN 40 MG/0.4ML PNKT: 40 | 28 days supply | Qty: 4 | Fill #0

## 2020-05-05 ENCOUNTER — Other Ambulatory Visit (HOSPITAL_COMMUNITY): Payer: Self-pay | Admitting: Rheumatology

## 2020-05-05 MED FILL — HYDROXYCHLOROQUINE SULFATE: 200 | 90 days supply | Qty: 180 | Fill #0

## 2020-05-12 ENCOUNTER — Other Ambulatory Visit (HOSPITAL_COMMUNITY): Payer: Self-pay | Admitting: Family Medicine

## 2020-05-12 MED FILL — FLUTICASONE PROP 50 MCG SPR: 50 | 90 days supply | Qty: 48 | Fill #0

## 2020-05-27 DIAGNOSIS — B309 Viral conjunctivitis, unspecified: Secondary | ICD-10-CM | POA: Diagnosis not present

## 2020-06-19 ENCOUNTER — Inpatient Hospital Stay: Payer: 59 | Admitting: Orthopaedic Surgery

## 2020-07-02 NOTE — Patient Instructions (Addendum)
DUE TO COVID-19 ONLY ONE VISITOR IS ALLOWED TO COME WITH YOU AND STAY IN THE WAITING ROOM ONLY DURING PRE OP AND PROCEDURE DAY OF SURGERY. THE 1 VISITOR  MAY VISIT WITH YOU AFTER SURGERY IN YOUR PRIVATE ROOM DURING VISITING HOURS ONLY!  YOU NEED TO HAVE A COVID 19 TEST ON: 07/09/19, THIS TEST MUST BE DONE BEFORE SURGERY,  COVID TESTING SITE [address] [place] [address], IT IS ON THE RIGHT GOING OUT [address] APPROXIMATELY  2 MINUTES PAST [employer] ON THE RIGHT. ONCE YOUR COVID TEST IS COMPLETED,  PLEASE BEGIN THE QUARANTINE INSTRUCTIONS AS OUTLINED IN YOUR HANDOUT.                Kynlee Ryle Gienger   Your procedure is scheduled on: 07/11/20   Report to [facility] Main  Entrance   Report to short stay at: 5:30 AM     Call this number if you have problems the morning of surgery [phone]    Remember:   NO SOLID FOOD AFTER MIDNIGHT THE NIGHT PRIOR TO SURGERY. NOTHING BY MOUTH EXCEPT CLEAR LIQUIDS UNTIL: 4:15 AM . PLEASE FINISH ENSURE DRINK PER SURGEON ORDER  WHICH NEEDS TO BE COMPLETED AT : 4:15 AM.  CLEAR LIQUID DIET   Foods Allowed                                                                     Foods Excluded  Coffee and tea, regular and decaf                             liquids that you cannot  Plain Jell-O any favor except red or purple                                           see through such as: Fruit ices (not with fruit pulp)                                     milk, soups, orange juice  Iced Popsicles                                    All solid food Carbonated beverages, regular and diet                                    Cranberry, grape and apple juices Sports drinks like Gatorade Lightly seasoned clear broth or consume(fat free) Sugar, honey syrup  Sample Menu Breakfast                                Lunch  Supper Cranberry juice                    Beef broth                             Chicken broth Jell-O                                     Grape juice                           Apple juice Coffee or tea                        Jell-O                                      Popsicle                                                Coffee or tea                        Coffee or tea  _____________________________________________________________________  BRUSH YOUR TEETH MORNING OF SURGERY AND RINSE YOUR MOUTH OUT, NO CHEWING GUM CANDY OR MINTS.    Take these medicines the morning of surgery with A SIP OF WATER: N/A                               You may not have any metal on your body including hair pins and              piercings  Do not wear jewelry, make-up, lotions, powders or perfumes, deodorant             Do not wear nail polish on your fingernails.  Do not shave  48 hours prior to surgery.          Do not bring valuables to the hospital. [place].  Contacts, dentures or bridgework may not be worn into surgery.  Leave suitcase in the car. After surgery it may be brought to your room.     Patients discharged the day of surgery will not be allowed to drive home. IF YOU ARE HAVING SURGERY AND GOING HOME THE SAME DAY, YOU MUST HAVE AN ADULT TO DRIVE YOU HOME AND BE WITH YOU FOR 24 HOURS. YOU MAY GO HOME BY TAXI OR UBER OR ORTHERWISE, BUT AN ADULT MUST ACCOMPANY YOU HOME AND STAY WITH YOU FOR 24 HOURS.  Name and phone number of your driver:  Special Instructions: N/A              Please read over the following fact sheets you were given: ____________________________________________________________________          [facility] - Preparing for Surgery Before surgery, you can play an important role.  Because skin is not sterile, your skin needs to be as free of  germs as possible.  You can reduce the number of germs on your skin by washing with CHG (chlorahexidine gluconate) soap before surgery.  CHG is an antiseptic cleaner which  kills germs and bonds with the skin to continue killing germs even after washing. Please DO NOT use if you have an allergy to CHG or antibacterial soaps.  If your skin becomes reddened/irritated stop using the CHG and inform your nurse when you arrive at Short Stay. Do not shave (including legs and underarms) for at least 48 hours prior to the first CHG shower.  You may shave your face/neck. Please follow these instructions carefully:  1.  Shower with CHG Soap the night before surgery and the  morning of Surgery.  2.  If you choose to wash your hair, wash your hair first as usual with your  normal  shampoo.  3.  After you shampoo, rinse your hair and body thoroughly to remove the  shampoo.                           4.  Use CHG as you would any other liquid soap.  You can apply chg directly  to the skin and wash                       Gently with a scrungie or clean washcloth.  5.  Apply the CHG Soap to your body ONLY FROM THE NECK DOWN.   Do not use on face/ open                           Wound or open sores. Avoid contact with eyes, ears mouth and genitals (private parts).                       Wash face,  Genitals (private parts) with your normal soap.             6.  Wash thoroughly, paying special attention to the area where your surgery  will be performed.  7.  Thoroughly rinse your body with warm water from the neck down.  8.  DO NOT shower/wash with your normal soap after using and rinsing off  the CHG Soap.                9.  Pat yourself dry with a clean towel.            10.  Wear clean pajamas.            11.  Place clean sheets on your bed the night of your first shower and do not  sleep with pets. Day of Surgery : Do not apply any lotions/deodorants the morning of surgery.  Please wear clean clothes to the hospital/surgery center.  FAILURE TO FOLLOW THESE INSTRUCTIONS MAY RESULT IN THE CANCELLATION OF YOUR SURGERY PATIENT SIGNATURE_________________________________  NURSE  SIGNATURE__________________________________  ________________________________________________________________________   Rogelia Mire  An incentive spirometer is a tool that can help keep your lungs clear and active. This tool measures how well you are filling your lungs with each breath. Taking long deep breaths may help reverse or decrease the chance of developing breathing (pulmonary) problems (especially infection) following:  A long period of time when you are unable to move or be active. BEFORE THE PROCEDURE   If the spirometer includes an indicator to show your best effort, your nurse  or respiratory therapist will set it to a desired goal.  If possible, sit up straight or lean slightly forward. Try not to slouch.  Hold the incentive spirometer in an upright position. INSTRUCTIONS FOR USE  1. Sit on the edge of your bed if possible, or sit up as far as you can in bed or on a chair. 2. Hold the incentive spirometer in an upright position. 3. Breathe out normally. 4. Place the mouthpiece in your mouth and seal your lips tightly around it. 5. Breathe in slowly and as deeply as possible, raising the piston or the ball toward the top of the column. 6. Hold your breath for 3-5 seconds or for as long as possible. Allow the piston or ball to fall to the bottom of the column. 7. Remove the mouthpiece from your mouth and breathe out normally. 8. Rest for a few seconds and repeat Steps 1 through 7 at least 10 times every 1-2 hours when you are awake. Take your time and take a few normal breaths between deep breaths. 9. The spirometer may include an indicator to show your best effort. Use the indicator as a goal to work toward during each repetition. 10. After each set of 10 deep breaths, practice coughing to be sure your lungs are clear. If you have an incision (the cut made at the time of surgery), support your incision when coughing by placing a pillow or rolled up towels firmly  against it. Once you are able to get out of bed, walk around indoors and cough well. You may stop using the incentive spirometer when instructed by your caregiver.  RISKS AND COMPLICATIONS  Take your time so you do not get dizzy or light-headed.  If you are in pain, you may need to take or ask for pain medication before doing incentive spirometry. It is harder to take a deep breath if you are having pain. AFTER USE  Rest and breathe slowly and easily.  It can be helpful to keep track of a log of your progress. Your caregiver can provide you with a simple table to help with this. If you are using the spirometer at home, follow these instructions: [place] IF:   You are having difficultly using the spirometer.  You have trouble using the spirometer as often as instructed.  Your pain medication is not giving enough relief while using the spirometer.  You develop fever of 100.5 F (38.1 C) or higher. SEEK IMMEDIATE MEDICAL CARE IF:   You cough up bloody sputum that had not been present before.  You develop fever of 102 F (38.9 C) or greater.  You develop worsening pain at or near the incision site. MAKE SURE YOU:   Understand these instructions.  Will watch your condition.  Will get help right away if you are not doing well or get worse. Document Released: 11/01/2006 Document Revised: 09/13/2011 Document Reviewed: 01/02/2007 [facility] Patient Information 2014 [place], [place].   ________________________________________________________________________

## 2020-07-07 ENCOUNTER — Other Ambulatory Visit: Payer: Self-pay

## 2020-07-07 ENCOUNTER — Encounter (HOSPITAL_COMMUNITY)
Admission: RE | Admit: 2020-07-07 | Discharge: 2020-07-07 | Disposition: A | Discharge status: Home / Self Care | Payer: 59 | Source: Ambulatory Visit | Attending: Orthopaedic Surgery | Admitting: Orthopaedic Surgery

## 2020-07-07 ENCOUNTER — Encounter (HOSPITAL_COMMUNITY): Payer: Self-pay

## 2020-07-07 DIAGNOSIS — Z01812 Encounter for preprocedural laboratory examination: Secondary | ICD-10-CM | POA: Diagnosis not present

## 2020-07-07 HISTORY — DX: Supraventricular tachycardia: I47.1

## 2020-07-07 HISTORY — DX: Chronic kidney disease, unspecified: N18.9

## 2020-07-07 HISTORY — DX: Anxiety disorder, unspecified: F41.9

## 2020-07-07 HISTORY — DX: Supraventricular tachycardia, unspecified: I47.10

## 2020-07-07 HISTORY — DX: Pneumonia, unspecified organism: J18.9

## 2020-07-07 LAB — CBC
HCT: 37.6 % (ref 36.0–46.0)
Hemoglobin: 12.8 g/dL (ref 12.0–15.0)
MCH: 32.3 pg (ref 26.0–34.0)
MCHC: 34 g/dL (ref 30.0–36.0)
MCV: 94.9 fL (ref 80.0–100.0)
Platelets: 225 10*3/uL (ref 150–400)
RBC: 3.96 MIL/uL (ref 3.87–5.11)
RDW: 12.7 % (ref 11.5–15.5)
WBC: 7.3 10*3/uL (ref 4.0–10.5)
nRBC: 0 % (ref 0.0–0.2)

## 2020-07-07 LAB — BASIC METABOLIC PANEL
Anion gap: 9 (ref 5–15)
BUN: 13 mg/dL (ref 6–20)
CO2: 24 mmol/L (ref 22–32)
Calcium: 9.5 mg/dL (ref 8.9–10.3)
Chloride: 104 mmol/L (ref 98–111)
Creatinine, Ser: 0.66 mg/dL (ref 0.44–1.00)
GFR, Estimated: >60 mL/min (ref >60)
Glucose, Bld: 101 mg/dL — ABNORMAL HIGH (ref 70–99)
Potassium: 4.3 mmol/L (ref 3.5–5.1)
Sodium: 137 mmol/L (ref 135–145)

## 2020-07-07 LAB — SURGICAL PCR SCREEN
MRSA, PCR: NEGATIVE
Staphylococcus aureus: POSITIVE — AB

## 2020-07-07 NOTE — Progress Notes (Signed)
COVID Vaccine Completed: Yes Date COVID Vaccine completed: 04/04/20 COVID vaccine manufacturer:   Moderna    PCP - Dr. Buckner [place] Cardiologist - Dr. Norman Herrlich Chest x-ray -  EKG - 08/31/19 Stress Test -  ECHO -  Cardiac Cath -  Pacemaker/ICD device last checked:  Sleep Study -  CPAP -   Fasting Blood Sugar -  Checks Blood Sugar _____ times a day  Blood Thinner Instructions: Aspirin Instructions: Last Dose:  Anesthesia review: Hx: SVT's.,palpitations  Patient denies shortness of breath, fever, cough and chest pain at PAT appointment   Patient verbalized understanding of instructions that were given to them at the PAT appointment. Patient was also instructed that they will need to review over the PAT instructions again at home before surgery.

## 2020-07-08 NOTE — Progress Notes (Signed)
Pt. Was informed that she needs to bring COVID test positive results from [facility] day of surgery.She verbalized that she has requested those results from health at works twice.

## 2020-07-09 NOTE — Progress Notes (Signed)
PCR: STAPH positive. 

## 2020-07-10 DIAGNOSIS — M1612 Unilateral primary osteoarthritis, left hip: Secondary | ICD-10-CM

## 2020-07-10 NOTE — Anesthesia Preprocedure Evaluation (Addendum)
Anesthesia Evaluation  Patient identified by MRN, date of birth, ID band Patient awake    Reviewed: Allergy & Precautions, NPO status , Patient's Chart, lab work & pertinent test results  Airway Mallampati: II  TM Distance: >3 FB Neck ROM: Full    Dental  (+) Teeth Intact, Dental Advisory Given   Pulmonary former smoker,    Pulmonary exam normal breath sounds clear to auscultation       Cardiovascular hypertension, Normal cardiovascular exam+ dysrhythmias Supra Ventricular Tachycardia  Rhythm:Regular Rate:Normal     Neuro/Psych  Headaches, PSYCHIATRIC DISORDERS Anxiety    GI/Hepatic Neg liver ROS, GERD  ,  Endo/Other  negative endocrine ROS  Renal/GU Renal InsufficiencyRenal disease     Musculoskeletal  (+) Arthritis , Rheumatoid disorders,  Avascular Necrosis Left Hip   Abdominal   Peds  Hematology negative hematology ROS (+) Plt 225k   Anesthesia Other Findings   Reproductive/Obstetrics                            Anesthesia Physical Anesthesia Plan  ASA: II  Anesthesia Plan: Spinal   Post-op Pain Management:    Induction: Intravenous  PONV Risk Score and Plan: 2 and Propofol infusion, Midazolam, Dexamethasone and Ondansetron  Airway Management Planned: Natural Airway and Nasal Cannula  Additional Equipment:   Intra-op Plan:   Post-operative Plan:   Informed Consent: I have reviewed the patients History and Physical, chart, labs and discussed the procedure including the risks, benefits and alternatives for the proposed anesthesia with the patient or authorized representative who has indicated his/her understanding and acceptance.     Dental advisory given  Plan Discussed with: CRNA  Anesthesia Plan Comments:        Anesthesia Quick Evaluation

## 2020-07-10 NOTE — H&P (Signed)
TOTAL HIP ADMISSION H&P  Patient is admitted for left total hip arthroplasty.  Subjective:  Chief Complaint: left hip pain  HPI: Alice Klein, 45 y.o. female, has a history of pain and functional disability in the left hip(s) due to arthritis and patient has failed non-surgical conservative treatments for greater than 12 weeks to include NSAID's and/or analgesics, corticosteriod injections, flexibility and strengthening excercises and activity modification.  Onset of symptoms was gradual starting 3 years ago with gradually worsening course since that time.The patient noted prior procedures of the hip to include arthroscopy on the left hip(s).  Patient currently rates pain in the left hip at 10 out of 10 with activity. Patient has night pain, worsening of pain with activity and weight bearing and pain that interfers with activities of daily living. Patient has evidence of periarticular osteophytes and joint space narrowing by imaging studies. This condition presents safety issues increasing the risk of falls.  There is no current active infection.  Patient Active Problem List   Diagnosis Date Noted  . Unilateral primary osteoarthritis, left hip 07/10/2020  . Palpitations 08/23/2019   Past Medical History:  Diagnosis Date  . Allergies   . Anxiety   . Chronic kidney disease    uritral re-implantation  . GERD (gastroesophageal reflux disease)   . Headache   . Palpitations 08/23/2019  . Pneumonia   . Reactive airway disease   . Seronegative rheumatoid arthritis ([place])   . SVT (supraventricular tachycardia) ([place])   . UTI (urinary tract infection)     Past Surgical History:  Procedure Laterality Date  . DILATION AND CURETTAGE OF UTERUS  2009  . ESOPHAGOGASTRODUODENOSCOPY  10/29/2013   Normal EGD  . HIP ARTHROPLASTY Left   . INTRAUTERINE DEVICE (IUD) INSERTION  02/2009  . OTHER SURGICAL HISTORY     Ureteral Surgery   . SHOULDER ARTHROSCOPY Right   . TONSILLECTOMY      No current  facility-administered medications for this encounter.   Current Outpatient Medications  Medication Sig Dispense Refill Last Dose  . Ascorbic Acid (VITAMIN C PO) Take 180 mg by mouth daily.     Marland Kitchen BIOTIN PO Take 240 mcg by mouth daily.     Marland Kitchen CALCIUM-VITAMIN D PO Take 1 tablet by mouth daily.     . Cyanocobalamin (B-12 PO) Take 180 mcg by mouth daily.     . Ferrous Sulfate (IRON PO) Take 2.67 mg by mouth daily.     . fluticasone (FLONASE) 50 MCG/ACT nasal spray Place 2 sprays into both nostrils daily.     . Folic Acid (FOLATE PO) Take 400 mcg by mouth daily.     . IODINE, KELP, PO Take 150 mcg by mouth daily.     . Probiotic Product (PROBIOTIC DAILY PO) Take 1 capsule by mouth daily.     . Pyridoxine HCl (B-6 PO) Take 8.5 mg by mouth daily.     . Thiamine HCl (THIAMINE PO) Take 4.8 mg by mouth daily.     Marland Kitchen VITAMIN A PO Take 450 mcg by mouth daily.     Marland Kitchen VITAMIN D PO Take 40 mcg by mouth daily.     Marland Kitchen VITAMIN E PO Take 60 mg by mouth daily.     . Adalimumab (HUMIRA PEN) 40 MG/0.4ML PNKT Inject 40 mg subcutaneous every week. 4 each 6   . ALPRAZolam (XANAX) 0.5 MG tablet Take 0.5 mg by mouth at bedtime as needed for anxiety.     . celecoxib (CELEBREX)  200 MG capsule Take 200 mg by mouth 2 (two) times daily.      . cyclobenzaprine (FLEXERIL) 10 MG tablet Take 10 mg by mouth as needed for muscle spasms.      . hydroxychloroquine (PLAQUENIL) 200 MG tablet Take 500 mg by mouth 2 (two) times daily.     . metoprolol succinate (TOPROL XL) 25 MG 24 hr tablet Take 1 tablet (25 mg total) by mouth daily. 90 tablet 1   . traMADol (ULTRAM) 50 MG tablet Take 50 mg by mouth every 8 (eight) hours as needed.      Allergies  Allergen Reactions  . Clarithromycin Shortness Of Breath  . Penicillin G Other (See Comments)    unknown  . Latex Rash    hives    Social History   Tobacco Use  . Smoking status: Former Smoker    Quit date: 2004    Years since quitting: 18.0  . Smokeless tobacco: Never Used   Substance Use Topics  . Alcohol use: Yes    Alcohol/week: 3.0 standard drinks    Types: 3 Glasses of wine per week    Comment: weekly    Family History  Problem Relation Age of Onset  . Colon polyps Mother   . Hypertension Mother   . Hypertension Father   . Stroke Brother   . Colon cancer Neg Hx      Review of Systems  All other systems reviewed and are negative.   Objective:  Physical Exam Vitals reviewed.  Constitutional:      Appearance: Normal appearance.  HENT:     Head: Normocephalic and atraumatic.  Eyes:     Extraocular Movements: Extraocular movements intact.     Pupils: Pupils are equal, round, and reactive to light.  Cardiovascular:     Rate and Rhythm: Normal rate.     Pulses: Normal pulses.  Pulmonary:     Effort: Pulmonary effort is normal.  Abdominal:     Palpations: Abdomen is soft.  Musculoskeletal:     Cervical back: Normal range of motion.     Left hip: Tenderness and bony tenderness present. Decreased range of motion. Decreased strength.  Neurological:     Mental Status: She is alert and oriented to person, place, and time.  Psychiatric:        Behavior: Behavior normal.     Vital signs in last 24 hours:    Labs:   Estimated body mass index is 23.63 kg/m as calculated from the following:   Height as of 07/07/20: 5\' 9"  (1.753 m).   Weight as of 07/07/20: 72.6 kg.   Imaging Review Plain radiographs demonstrate moderate degenerative joint disease of the left hip(s). The bone quality appears to be excellent for age and reported activity level.      Assessment/Plan:  End stage arthritis, left hip(s)  The patient history, physical examination, clinical judgement of the provider and imaging studies are consistent with end stage degenerative joint disease of the left hip(s) and total hip arthroplasty is deemed medically necessary. The treatment options including medical management, injection therapy, arthroscopy and arthroplasty were  discussed at length. The risks and benefits of total hip arthroplasty were presented and reviewed. The risks due to aseptic loosening, infection, stiffness, dislocation/subluxation,  thromboembolic complications and other imponderables were discussed.  The patient acknowledged the explanation, agreed to proceed with the plan and consent was signed. Patient is being admitted for inpatient treatment for surgery, pain control, PT, OT, prophylactic antibiotics, VTE prophylaxis,  progressive ambulation and ADL's and discharge planning.The patient is planning to be discharged home with home health services

## 2020-07-11 ENCOUNTER — Other Ambulatory Visit: Payer: Self-pay | Admitting: Orthopaedic Surgery

## 2020-07-11 ENCOUNTER — Ambulatory Visit (HOSPITAL_COMMUNITY): Payer: 59

## 2020-07-11 ENCOUNTER — Other Ambulatory Visit: Payer: Self-pay

## 2020-07-11 ENCOUNTER — Ambulatory Visit (HOSPITAL_COMMUNITY): Payer: 59 | Admitting: Physician Assistant

## 2020-07-11 ENCOUNTER — Encounter (HOSPITAL_COMMUNITY): Payer: Self-pay | Admitting: Orthopaedic Surgery

## 2020-07-11 ENCOUNTER — Encounter (HOSPITAL_COMMUNITY)
Admission: RE | Disposition: A | Discharge status: Home / Self Care | Payer: Self-pay | Source: Home / Self Care | Attending: Orthopaedic Surgery

## 2020-07-11 ENCOUNTER — Observation Stay (HOSPITAL_COMMUNITY)
Admission: RE | Admit: 2020-07-11 | Discharge: 2020-07-12 | Disposition: A | Discharge status: Home / Self Care | Payer: 59 | Attending: Orthopaedic Surgery | Admitting: Orthopaedic Surgery

## 2020-07-11 ENCOUNTER — Ambulatory Visit (HOSPITAL_COMMUNITY): Payer: 59 | Admitting: Certified Registered"

## 2020-07-11 ENCOUNTER — Telehealth: Payer: Self-pay | Admitting: Orthopaedic Surgery

## 2020-07-11 DIAGNOSIS — Z7951 Long term (current) use of inhaled steroids: Secondary | ICD-10-CM | POA: Insufficient documentation

## 2020-07-11 DIAGNOSIS — Z87891 Personal history of nicotine dependence: Secondary | ICD-10-CM | POA: Insufficient documentation

## 2020-07-11 DIAGNOSIS — Z471 Aftercare following joint replacement surgery: Secondary | ICD-10-CM | POA: Diagnosis not present

## 2020-07-11 DIAGNOSIS — M1612 Unilateral primary osteoarthritis, left hip: Secondary | ICD-10-CM | POA: Diagnosis not present

## 2020-07-11 DIAGNOSIS — I129 Hypertensive chronic kidney disease with stage 1 through stage 4 chronic kidney disease, or unspecified chronic kidney disease: Secondary | ICD-10-CM | POA: Diagnosis not present

## 2020-07-11 DIAGNOSIS — Z96642 Presence of left artificial hip joint: Secondary | ICD-10-CM | POA: Diagnosis not present

## 2020-07-11 DIAGNOSIS — J45909 Unspecified asthma, uncomplicated: Secondary | ICD-10-CM | POA: Insufficient documentation

## 2020-07-11 DIAGNOSIS — Z91040 Latex allergy status: Secondary | ICD-10-CM | POA: Diagnosis not present

## 2020-07-11 DIAGNOSIS — Z96649 Presence of unspecified artificial hip joint: Secondary | ICD-10-CM

## 2020-07-11 DIAGNOSIS — M87852 Other osteonecrosis, left femur: Secondary | ICD-10-CM | POA: Diagnosis not present

## 2020-07-11 DIAGNOSIS — N189 Chronic kidney disease, unspecified: Secondary | ICD-10-CM | POA: Insufficient documentation

## 2020-07-11 DIAGNOSIS — S82122A Displaced fracture of lateral condyle of left tibia, initial encounter for closed fracture: Secondary | ICD-10-CM

## 2020-07-11 HISTORY — PX: TOTAL HIP ARTHROPLASTY: SHX124

## 2020-07-11 LAB — PREGNANCY, URINE: Preg Test, Ur: NEGATIVE

## 2020-07-11 IMAGING — DX DG PORTABLE PELVIS
1 series · 1 of 1 positions shown · non-contrast
Comparison: Portable exam [5H] hours compared to intraoperative
images of [DATE]

CLINICAL DATA: Post LEFT hip arthroplasty

EXAM:
PORTABLE PELVIS 1-2 VIEWS

[pelvis ap]
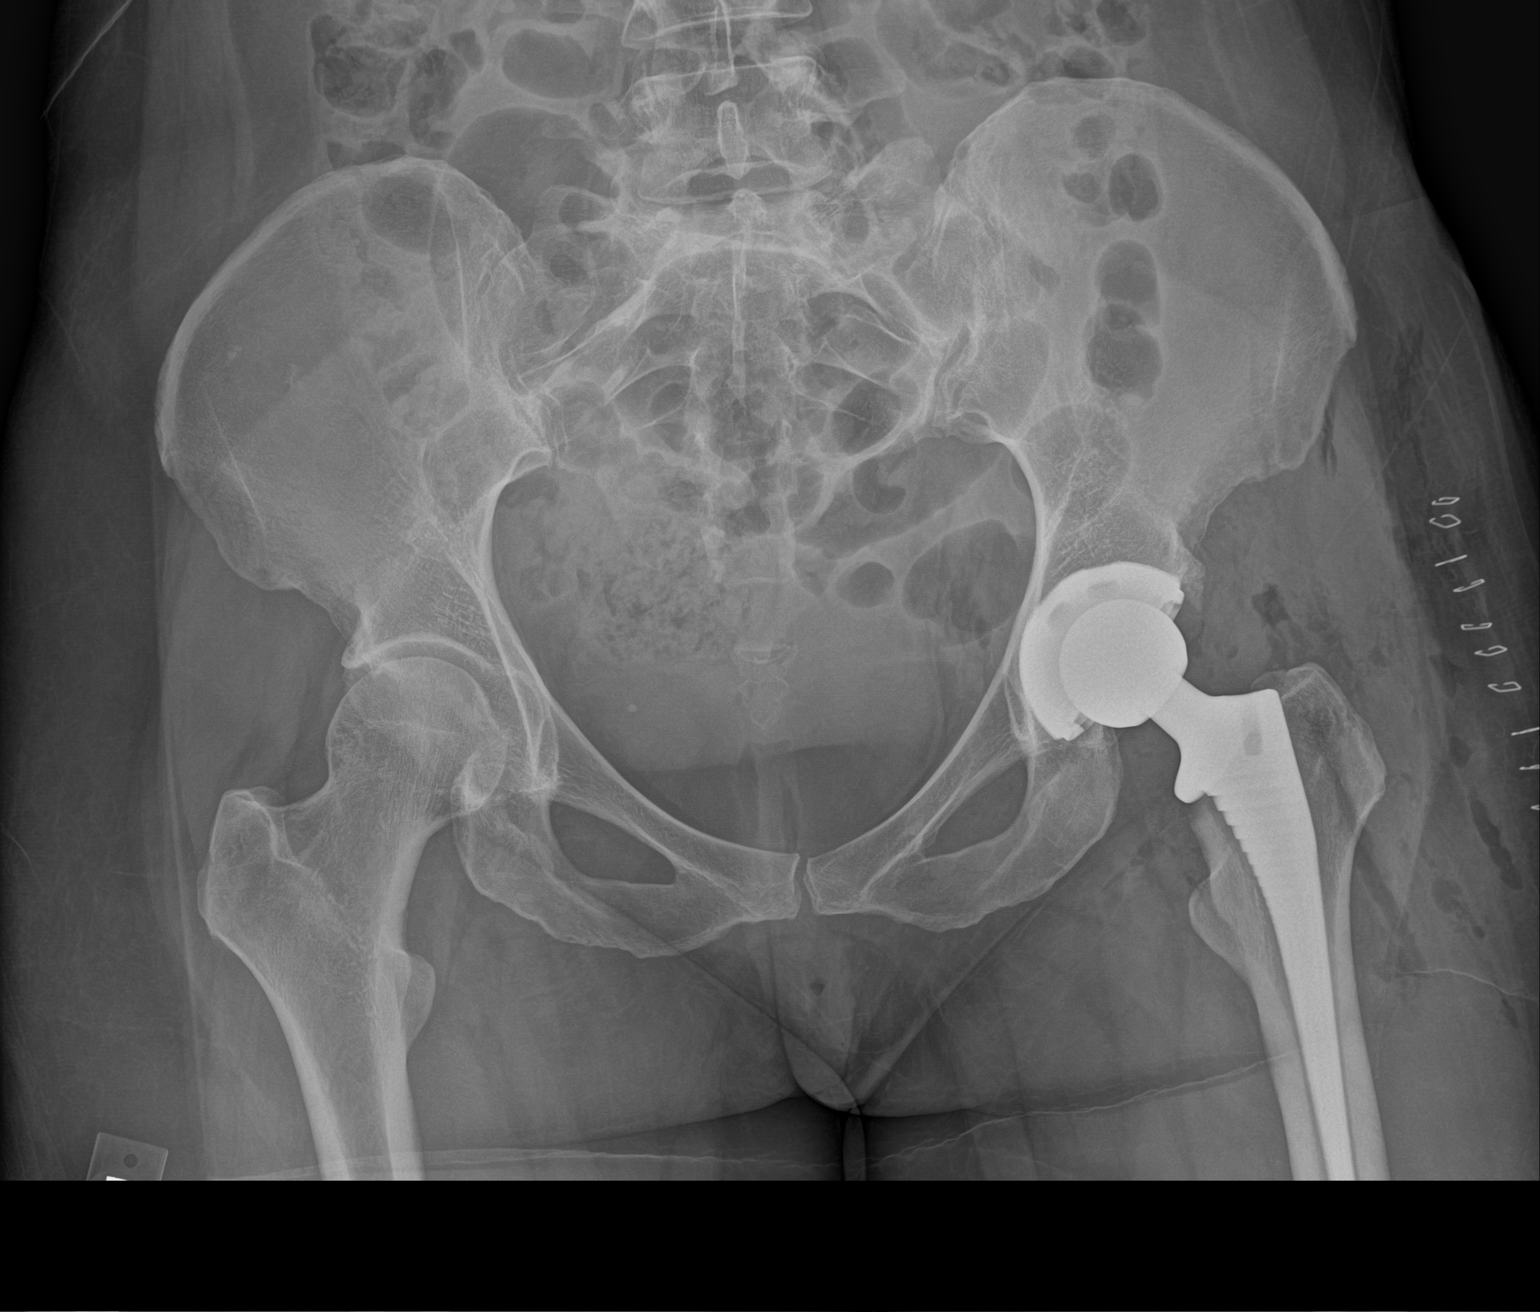

[1 of 1 positions shown; findings below may reference images not displayed]

FINDINGS: LEFT hip prosthesis in expected position.

No fracture, dislocation or bone destruction identified on single AP
view.

Expected postsurgical changes of the soft tissues with overlying
skin clips.
IMPRESSION: LEFT hip prosthesis without acute complication on single AP view.

## 2020-07-11 IMAGING — RF DG HIP (WITH PELVIS) OPERATIVE*L*
1 series · 3 of 3 positions shown · non-contrast
Comparison: [DATE] left hip radiographs

CLINICAL DATA: Intraoperative images of left anterior hip
replacement

EXAM:
OPERATIVE left HIP (WITH PELVIS IF PERFORMED) 2 VIEWS
TECHNIQUE: Fluoroscopic spot image(s) were submitted for interpretation
post-operatively.

[Series 1: unknown protocol · 0.20mm/px · 3 of 3 slices shown]
[im 1/3]
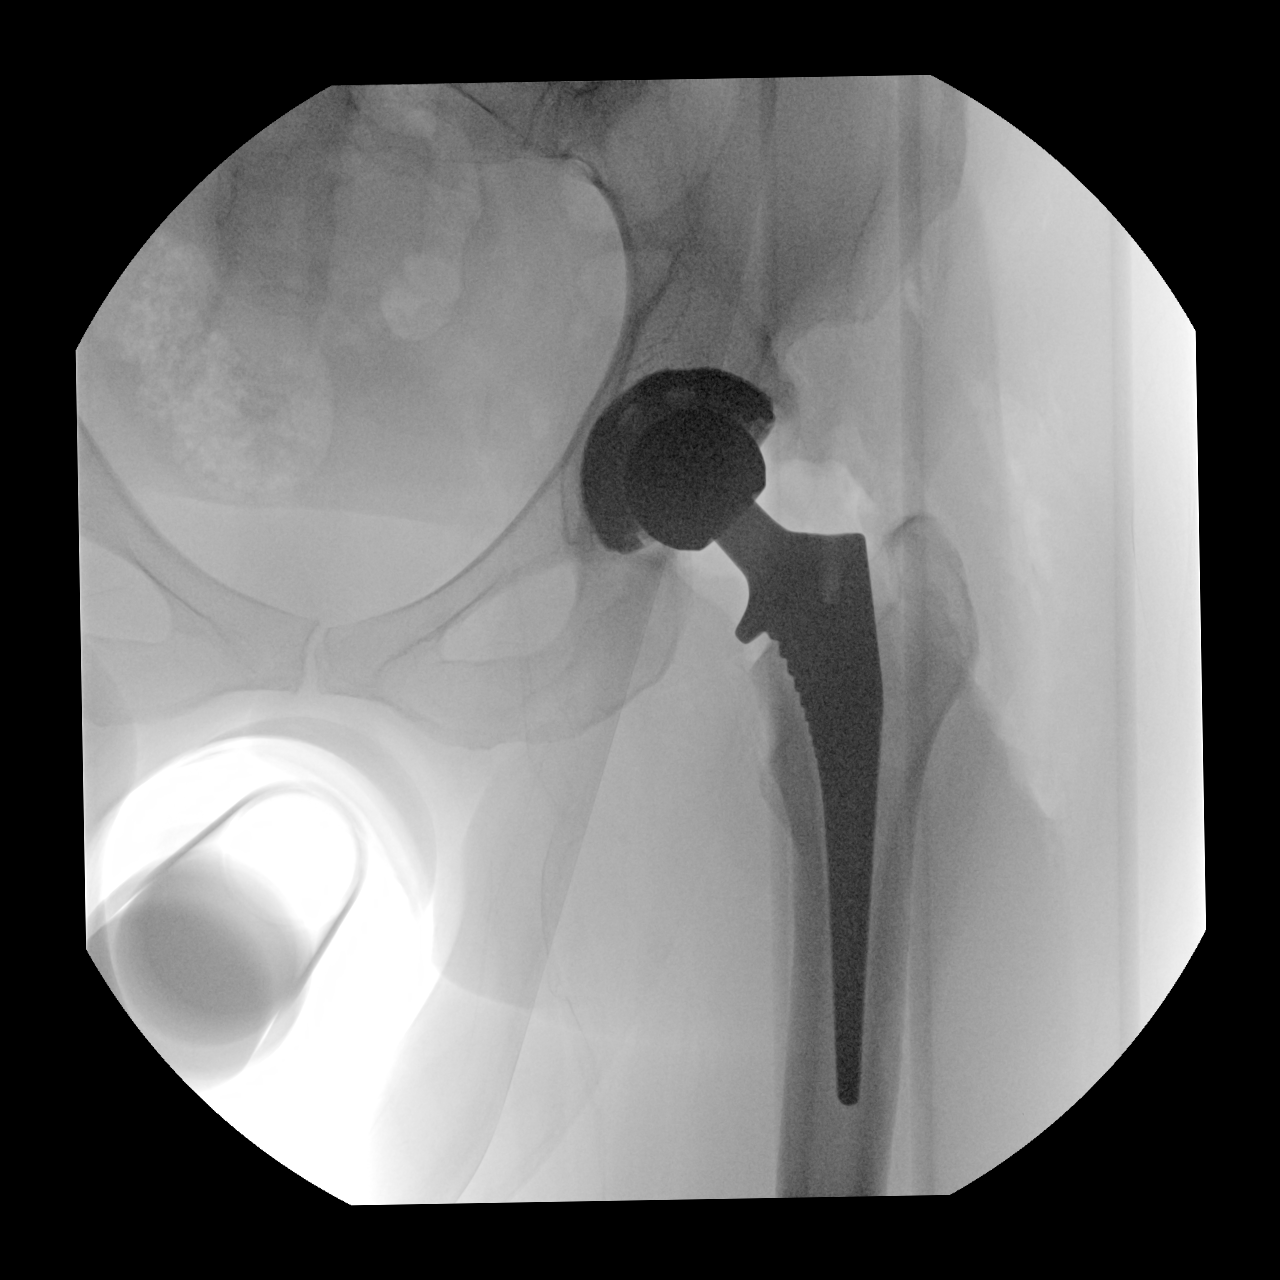
[im 2/3]
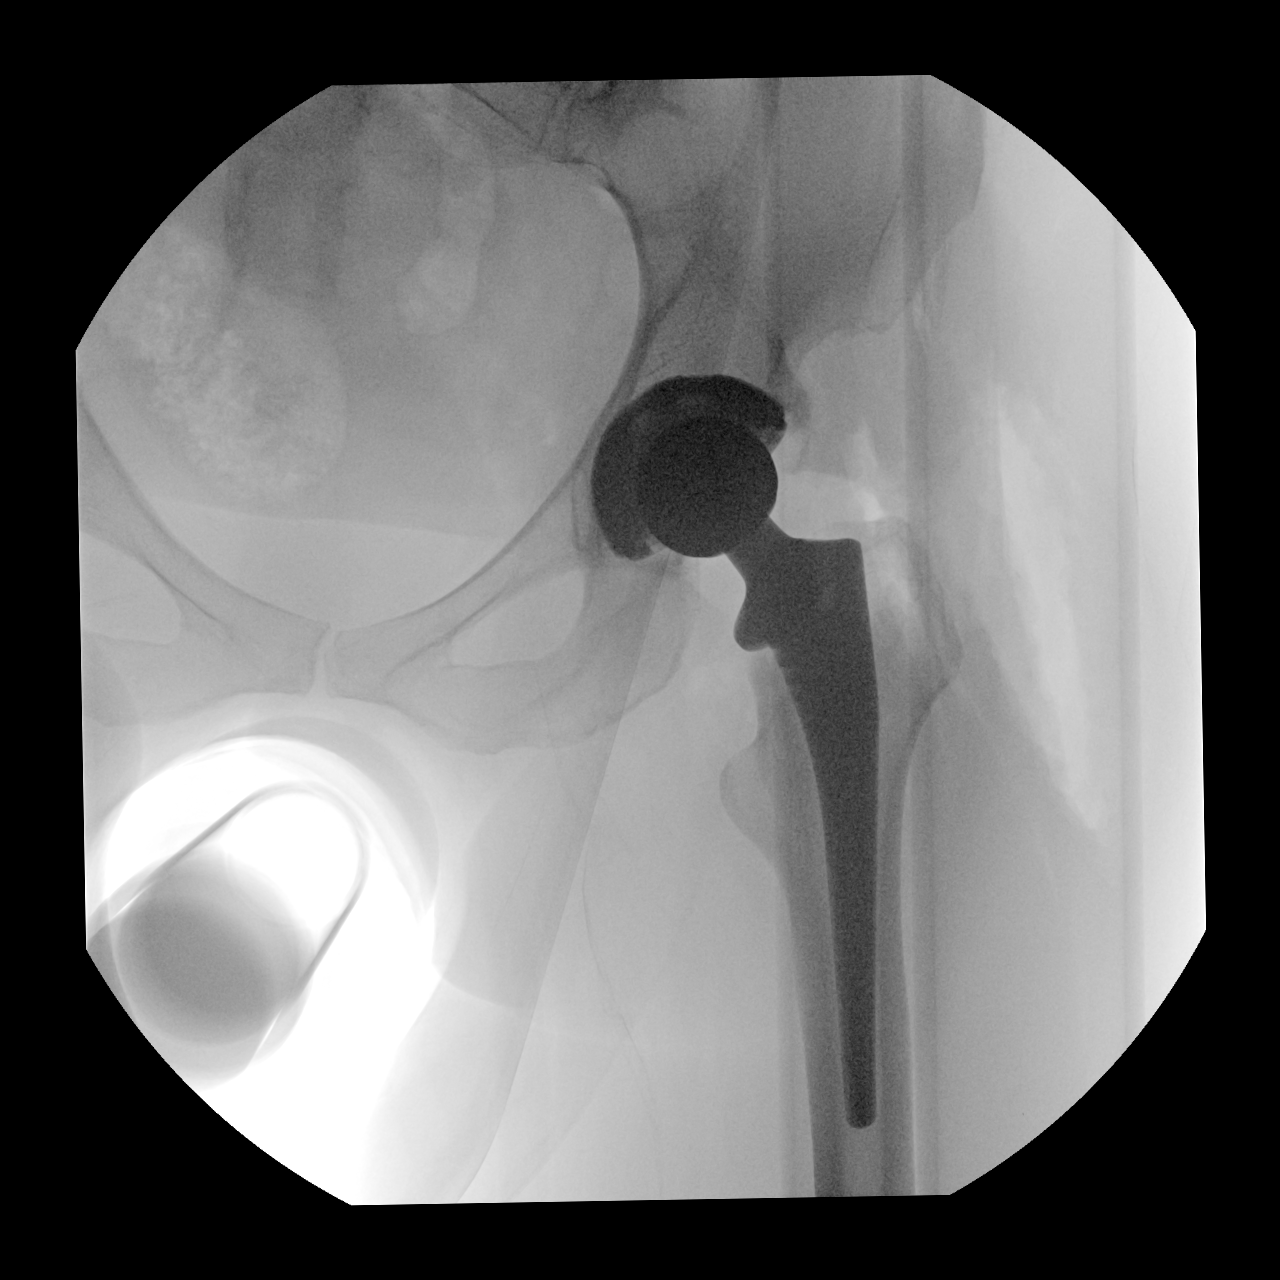
[im 3/3]
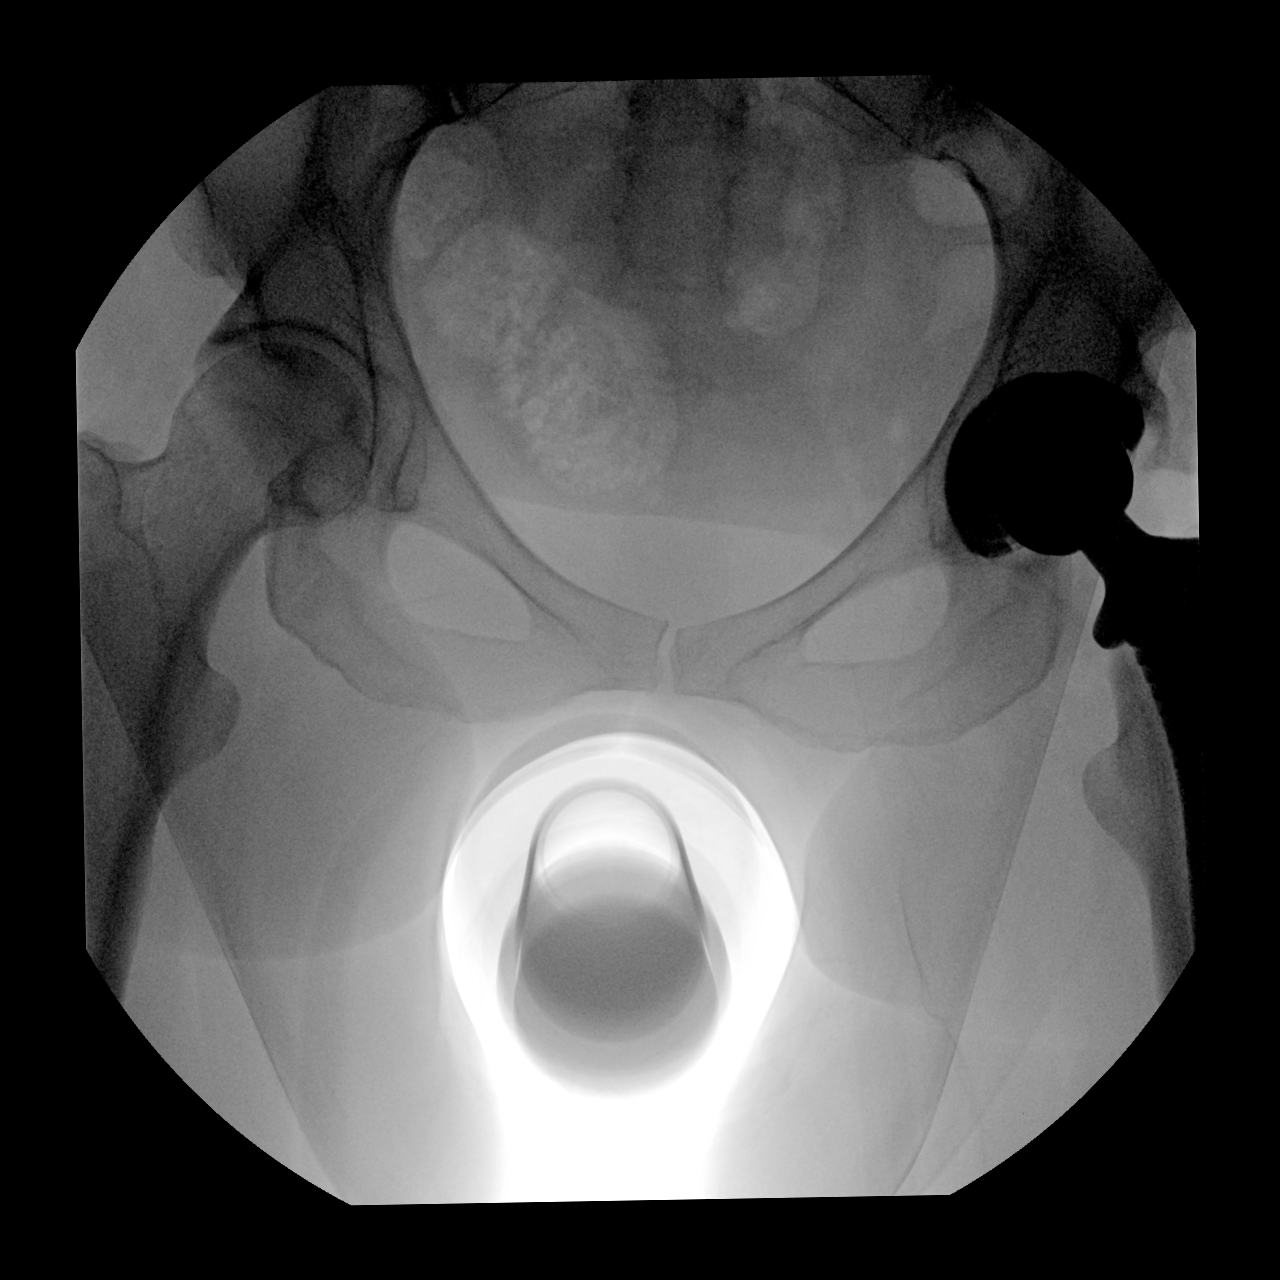

[3 of 3 positions shown; findings below may reference images not displayed]

FINDINGS: Submitted images demonstrate left total hip prosthesis. Subcutaneous
emphysema consistent with intraoperative status.
IMPRESSION: Intraoperative fluoroscopic images of the left hip replacement as
above.

## 2020-07-11 SURGERY — ARTHROPLASTY, HIP, TOTAL, ANTERIOR APPROACH
Anesthesia: Spinal | Site: Hip | Laterality: Left

## 2020-07-11 MED ORDER — CLINDAMYCIN PHOSPHATE 900 MG/50ML IV SOLN
900.0000 mg | INTRAVENOUS | Status: AC
  Administered 2020-07-11: 900 mg via INTRAVENOUS
  Filled 2020-07-11: qty 50

## 2020-07-11 MED ORDER — ONDANSETRON 4 MG PO TBDP: 4.0000 mg | ORAL_TABLET | Freq: Three times a day (TID) | ORAL | 0 refills | Status: DC | PRN

## 2020-07-11 MED ORDER — METHOCARBAMOL 500 MG IVPB - SIMPLE MED
INTRAVENOUS | Status: AC
  Filled 2020-07-11: qty 50

## 2020-07-11 MED ORDER — OXYCODONE HCL 5 MG PO TABS
5.0000 mg | ORAL_TABLET | ORAL | Status: DC | PRN
  Administered 2020-07-11 (×2): 5 mg via ORAL
  Filled 2020-07-11 (×2): qty 2

## 2020-07-11 MED ORDER — ACETAMINOPHEN 325 MG PO TABS: 325.0000 mg | ORAL_TABLET | Freq: Four times a day (QID) | ORAL | Status: DC | PRN

## 2020-07-11 MED ORDER — BUPIVACAINE IN DEXTROSE 0.75-8.25 % IT SOLN
INTRATHECAL | Status: DC | PRN
  Administered 2020-07-11: 1.6 mL via INTRATHECAL

## 2020-07-11 MED ORDER — ACETAMINOPHEN 500 MG PO TABS
1000.0000 mg | ORAL_TABLET | Freq: Once | ORAL | Status: AC
  Administered 2020-07-11: 1000 mg via ORAL
  Filled 2020-07-11: qty 2

## 2020-07-11 MED ORDER — DOCUSATE SODIUM 100 MG PO CAPS
100.0000 mg | ORAL_CAPSULE | Freq: Two times a day (BID) | ORAL | Status: DC
  Administered 2020-07-11 – 2020-07-12 (×2): 100 mg via ORAL
  Filled 2020-07-11 (×2): qty 1

## 2020-07-11 MED ORDER — LACTATED RINGERS IV BOLUS
250.0000 mL | Freq: Once | INTRAVENOUS | Status: AC
  Administered 2020-07-11: 250 mL via INTRAVENOUS

## 2020-07-11 MED ORDER — CHLORHEXIDINE GLUCONATE 0.12 % MT SOLN
15.0000 mL | Freq: Once | OROMUCOSAL | Status: AC
  Administered 2020-07-11: 15 mL via OROMUCOSAL

## 2020-07-11 MED ORDER — ORAL CARE MOUTH RINSE: 15.0000 mL | Freq: Once | OROMUCOSAL | Status: AC

## 2020-07-11 MED ORDER — PROPOFOL 500 MG/50ML IV EMUL
INTRAVENOUS | Status: DC | PRN
  Administered 2020-07-11: 75 ug/kg/min via INTRAVENOUS

## 2020-07-11 MED ORDER — SODIUM CHLORIDE 0.9 % IR SOLN
Status: DC | PRN
  Administered 2020-07-11: 1000 mL

## 2020-07-11 MED ORDER — FENTANYL CITRATE (PF) 250 MCG/5ML IJ SOLN
INTRAMUSCULAR | Status: DC | PRN
  Administered 2020-07-11: 100 ug via INTRAVENOUS

## 2020-07-11 MED ORDER — CELECOXIB 200 MG PO CAPS
200.0000 mg | ORAL_CAPSULE | Freq: Two times a day (BID) | ORAL | Status: DC
  Administered 2020-07-11 – 2020-07-12 (×2): 200 mg via ORAL
  Filled 2020-07-11 (×2): qty 1

## 2020-07-11 MED ORDER — PROPOFOL 10 MG/ML IV BOLUS
INTRAVENOUS | Status: DC | PRN
  Administered 2020-07-11 (×3): 40 mg via INTRAVENOUS

## 2020-07-11 MED ORDER — PANTOPRAZOLE SODIUM 40 MG PO TBEC
40.0000 mg | DELAYED_RELEASE_TABLET | Freq: Every day | ORAL | Status: DC
  Administered 2020-07-12: 40 mg via ORAL
  Filled 2020-07-11: qty 1

## 2020-07-11 MED ORDER — CLINDAMYCIN PHOSPHATE 600 MG/50ML IV SOLN
600.0000 mg | Freq: Four times a day (QID) | INTRAVENOUS | Status: AC
Start: 2020-07-11 — End: 2020-07-11
  Filled 2020-07-11: qty 50

## 2020-07-11 MED ORDER — ONDANSETRON HCL 4 MG/2ML IJ SOLN
INTRAMUSCULAR | Status: DC | PRN
  Administered 2020-07-11: 4 mg via INTRAVENOUS

## 2020-07-11 MED ORDER — ONDANSETRON HCL 4 MG/2ML IJ SOLN: 4.0000 mg | Freq: Four times a day (QID) | INTRAMUSCULAR | Status: DC | PRN

## 2020-07-11 MED ORDER — OXYCODONE HCL 5 MG PO TABS
ORAL_TABLET | ORAL | Status: AC
  Filled 2020-07-11: qty 1

## 2020-07-11 MED ORDER — METOCLOPRAMIDE HCL 5 MG/ML IJ SOLN: 5.0000 mg | Freq: Three times a day (TID) | INTRAMUSCULAR | Status: DC | PRN

## 2020-07-11 MED ORDER — LACTATED RINGERS IV BOLUS
500.0000 mL | Freq: Once | INTRAVENOUS | Status: AC
  Administered 2020-07-11: 500 mL via INTRAVENOUS

## 2020-07-11 MED ORDER — PHENOL 1.4 % MT LIQD: 1.0000 | OROMUCOSAL | Status: DC | PRN

## 2020-07-11 MED ORDER — STERILE WATER FOR IRRIGATION IR SOLN
Status: DC | PRN
  Administered 2020-07-11: 2000 mL

## 2020-07-11 MED ORDER — FENTANYL CITRATE (PF) 250 MCG/5ML IJ SOLN
INTRAMUSCULAR | Status: AC
  Filled 2020-07-11: qty 5

## 2020-07-11 MED ORDER — CLINDAMYCIN PHOSPHATE 900 MG/50ML IV SOLN
INTRAVENOUS | Status: AC
  Administered 2020-07-11: 900 mg
  Filled 2020-07-11: qty 50

## 2020-07-11 MED ORDER — MIDAZOLAM HCL 2 MG/2ML IJ SOLN
INTRAMUSCULAR | Status: AC
  Filled 2020-07-11: qty 2

## 2020-07-11 MED ORDER — METOPROLOL SUCCINATE ER 25 MG PO TB24
25.0000 mg | ORAL_TABLET | Freq: Every day | ORAL | Status: DC
  Filled 2020-07-11: qty 1

## 2020-07-11 MED ORDER — PHENYLEPHRINE 40 MCG/ML (10ML) SYRINGE FOR IV PUSH (FOR BLOOD PRESSURE SUPPORT)
PREFILLED_SYRINGE | INTRAVENOUS | Status: DC | PRN
  Administered 2020-07-11: 120 ug via INTRAVENOUS

## 2020-07-11 MED ORDER — ONDANSETRON HCL 4 MG PO TABS: 4.0000 mg | ORAL_TABLET | Freq: Four times a day (QID) | ORAL | Status: DC | PRN

## 2020-07-11 MED ORDER — MIDAZOLAM HCL 2 MG/2ML IJ SOLN
INTRAMUSCULAR | Status: DC | PRN
  Administered 2020-07-11: 2 mg via INTRAVENOUS

## 2020-07-11 MED ORDER — MENTHOL 3 MG MT LOZG: 1.0000 | LOZENGE | OROMUCOSAL | Status: DC | PRN

## 2020-07-11 MED ORDER — OXYCODONE HCL 5 MG PO TABS: 5.0000 mg | ORAL_TABLET | ORAL | 0 refills | Status: DC | PRN

## 2020-07-11 MED ORDER — LACTATED RINGERS IV SOLN: INTRAVENOUS | Status: DC

## 2020-07-11 MED ORDER — OXYCODONE HCL 5 MG PO TABS
10.0000 mg | ORAL_TABLET | ORAL | Status: DC | PRN
  Administered 2020-07-11 – 2020-07-12 (×4): 10 mg via ORAL
  Filled 2020-07-11 (×2): qty 2

## 2020-07-11 MED ORDER — 0.9 % SODIUM CHLORIDE (POUR BTL) OPTIME
TOPICAL | Status: DC | PRN
  Administered 2020-07-11: 1000 mL

## 2020-07-11 MED ORDER — LIDOCAINE 2% (20 MG/ML) 5 ML SYRINGE
INTRAMUSCULAR | Status: DC | PRN
  Administered 2020-07-11: 40 mg via INTRAVENOUS

## 2020-07-11 MED ORDER — FENTANYL CITRATE (PF) 100 MCG/2ML IJ SOLN
INTRAMUSCULAR | Status: AC
  Filled 2020-07-11: qty 2

## 2020-07-11 MED ORDER — METHOCARBAMOL 500 MG PO TABS: 500.0000 mg | ORAL_TABLET | Freq: Four times a day (QID) | ORAL | 1 refills | Status: DC | PRN

## 2020-07-11 MED ORDER — DEXAMETHASONE SODIUM PHOSPHATE 10 MG/ML IJ SOLN
INTRAMUSCULAR | Status: DC | PRN
  Administered 2020-07-11: 10 mg via INTRAVENOUS

## 2020-07-11 MED ORDER — CLINDAMYCIN PHOSPHATE 900 MG/50ML IV SOLN
INTRAVENOUS | Status: AC
  Filled 2020-07-11: qty 50

## 2020-07-11 MED ORDER — OXYCODONE HCL 5 MG PO TABS
ORAL_TABLET | ORAL | Status: AC
  Filled 2020-07-11: qty 2

## 2020-07-11 MED ORDER — METOCLOPRAMIDE HCL 5 MG PO TABS: 5.0000 mg | ORAL_TABLET | Freq: Three times a day (TID) | ORAL | Status: DC | PRN

## 2020-07-11 MED ORDER — METHOCARBAMOL 500 MG IVPB - SIMPLE MED
500.0000 mg | Freq: Four times a day (QID) | INTRAVENOUS | Status: DC | PRN
  Administered 2020-07-11: 500 mg via INTRAVENOUS
  Filled 2020-07-11: qty 50

## 2020-07-11 MED ORDER — ASPIRIN 81 MG PO CHEW
81.0000 mg | CHEWABLE_TABLET | Freq: Two times a day (BID) | ORAL | Status: DC
  Administered 2020-07-11 – 2020-07-12 (×2): 81 mg via ORAL
  Filled 2020-07-11 (×2): qty 1

## 2020-07-11 MED ORDER — FENTANYL CITRATE (PF) 100 MCG/2ML IJ SOLN
25.0000 ug | INTRAMUSCULAR | Status: DC | PRN
  Administered 2020-07-11 (×2): 50 ug via INTRAVENOUS

## 2020-07-11 MED ORDER — PHENYLEPHRINE HCL-NACL 10-0.9 MG/250ML-% IV SOLN
INTRAVENOUS | Status: DC | PRN
  Administered 2020-07-11: 25 ug/min via INTRAVENOUS

## 2020-07-11 MED ORDER — PROMETHAZINE HCL 25 MG/ML IJ SOLN
6.2500 mg | INTRAMUSCULAR | Status: DC | PRN
Start: 2020-07-11 — End: 2020-07-11

## 2020-07-11 MED ORDER — ASPIRIN 81 MG PO CHEW: 81.0000 mg | CHEWABLE_TABLET | Freq: Two times a day (BID) | ORAL | 0 refills | Status: DC

## 2020-07-11 MED ORDER — SODIUM CHLORIDE 0.9 % IV SOLN: INTRAVENOUS | Status: DC

## 2020-07-11 MED ORDER — ALUM & MAG HYDROXIDE-SIMETH 200-200-20 MG/5ML PO SUSP: 30.0000 mL | ORAL | Status: DC | PRN

## 2020-07-11 MED ORDER — HYDROMORPHONE HCL 1 MG/ML IJ SOLN: 0.5000 mg | INTRAMUSCULAR | Status: DC | PRN

## 2020-07-11 MED ORDER — METHOCARBAMOL 500 MG PO TABS
500.0000 mg | ORAL_TABLET | Freq: Four times a day (QID) | ORAL | Status: DC | PRN
  Administered 2020-07-12: 500 mg via ORAL
  Filled 2020-07-11: qty 1

## 2020-07-11 MED ORDER — DIPHENHYDRAMINE HCL 12.5 MG/5ML PO ELIX: 12.5000 mg | ORAL_SOLUTION | ORAL | Status: DC | PRN

## 2020-07-11 MED FILL — oxyCODONE HCL 5 MG TABS: 5 | 3 days supply | Qty: 30 | Fill #0

## 2020-07-11 MED FILL — ONDANSETRON ODT 4 MG TABLET: 4 | 6 days supply | Qty: 20 | Fill #0

## 2020-07-11 MED FILL — ASPIRIN LOW DOSE 81 MG CHEW: 81 | 15 days supply | Qty: 30 | Fill #0

## 2020-07-11 MED FILL — METHOCARBAMOL 500 MG TABS: 500 | 10 days supply | Qty: 40 | Fill #0

## 2020-07-11 SURGICAL SUPPLY — 39 items
BAG ZIPLOCK 12X15 (MISCELLANEOUS) IMPLANT
BENZOIN TINCTURE PRP APPL 2/3 (GAUZE/BANDAGES/DRESSINGS) IMPLANT
BLADE SAW SGTL 18X1.27X75 (BLADE) ×2 IMPLANT
BLADE SAW SGTL 18X1.27X75MM (BLADE) ×1
CLOSURE WOUND 1/2 X4 (GAUZE/BANDAGES/DRESSINGS)
COVER PERINEAL POST (MISCELLANEOUS) ×3 IMPLANT
COVER SURGICAL LIGHT HANDLE (MISCELLANEOUS) ×3 IMPLANT
COVER WAND RF STERILE (DRAPES) ×3 IMPLANT
CUP SECTOR GRIPTON 50MM (Cup) ×3 IMPLANT
DRAPE STERI IOBAN 125X83 (DRAPES) ×3 IMPLANT
DRAPE U-SHAPE 47X51 STRL (DRAPES) ×6 IMPLANT
DRSG AQUACEL AG ADV 3.5X10 (GAUZE/BANDAGES/DRESSINGS) ×3 IMPLANT
DURAPREP 26ML APPLICATOR (WOUND CARE) ×3 IMPLANT
ELECT REM PT RETURN 15FT ADLT (MISCELLANEOUS) ×3 IMPLANT
GAUZE XEROFORM 1X8 LF (GAUZE/BANDAGES/DRESSINGS) ×3 IMPLANT
GLOVE BIO SURGEON STRL SZ7.5 (GLOVE) ×3 IMPLANT
GLOVE ECLIPSE 8.0 STRL XLNG CF (GLOVE) ×3 IMPLANT
GLOVE SRG 8 PF TXTR STRL LF DI (GLOVE) ×2 IMPLANT
GLOVE SURG UNDER POLY LF SZ8 (GLOVE) ×4
GOWN STRL REUS W/TWL XL LVL3 (GOWN DISPOSABLE) ×6 IMPLANT
HANDPIECE INTERPULSE COAX TIP (DISPOSABLE) ×2
HEAD FEMORAL 32 CERAMIC (Hips) ×3 IMPLANT
HOLDER FOLEY CATH W/STRAP (MISCELLANEOUS) ×3 IMPLANT
KIT TURNOVER KIT A (KITS) IMPLANT
LINER ACETABULAR 32X50 (Liner) ×3 IMPLANT
PACK ANTERIOR HIP CUSTOM (KITS) ×3 IMPLANT
PENCIL SMOKE EVACUATOR (MISCELLANEOUS) IMPLANT
SET HNDPC FAN SPRY TIP SCT (DISPOSABLE) ×1 IMPLANT
STAPLER VISISTAT 35W (STAPLE) IMPLANT
STEM CORAIL KA09 (Stem) ×3 IMPLANT
STRIP CLOSURE SKIN 1/2X4 (GAUZE/BANDAGES/DRESSINGS) IMPLANT
SUT ETHIBOND NAB CT1 #1 30IN (SUTURE) ×3 IMPLANT
SUT ETHILON 2 0 PS N (SUTURE) IMPLANT
SUT MNCRL AB 4-0 PS2 18 (SUTURE) IMPLANT
SUT VIC AB 0 CT1 36 (SUTURE) ×3 IMPLANT
SUT VIC AB 1 CT1 36 (SUTURE) ×3 IMPLANT
SUT VIC AB 2-0 CT1 27 (SUTURE) ×4
SUT VIC AB 2-0 CT1 TAPERPNT 27 (SUTURE) ×2 IMPLANT
TRAY FOLEY MTR SLVR 16FR STAT (SET/KITS/TRAYS/PACK) IMPLANT

## 2020-07-11 NOTE — Op Note (Signed)
NAME: HIEDI, TOUCHTON MEDICAL RECORD MV:[id number] ACCOUNT [id number] DATE OF BIRTH:Oct 06, 1975 FACILITY: [facility] LOCATION: [facility] PHYSICIAN:Simar Pothier Kerry Fort, MD  OPERATIVE REPORT  DATE OF PROCEDURE:  07/11/2020  PREOPERATIVE DIAGNOSES:  Primary osteoarthritis and degenerative joint disease, left hip, with a history of left hip arthroscopic surgery and labral repair.  POSTOPERATIVE DIAGNOSES:  Primary osteoarthritis and degenerative joint disease, left hip, with a history of left hip arthroscopic surgery and labral repair.  PROCEDURE:  Left total hip arthroplasty through direct anterior approach.  IMPLANTS:  DePuy Sector Gription acetabular component size 50, size 32+0 neutral polyethylene liner, size 9 Corail femoral component with standard offset, size 32+1 ceramic hip ball.  SURGEON:  Lind Guest. Ninfa Linden, MD  ASSISTANT:  Erskine Emery, PA-C  ANESTHESIA:  Spinal.  ANTIBIOTICS:  900 mg IV clindamycin.  BLOOD LOSS:  350 mL.  COMPLICATIONS:  None.  INDICATIONS:  The patient is a very pleasant and active 45 year old female who unfortunately had to undergo an arthroscopic intervention for her left hip several years ago with an extensive labral repair.  Since then, she has slowly developed worsening  arthritis around the superolateral aspect of her left hip.  This affects the femoral head and the acetabulum.  She has had conservative treatment including steroid injections which did help in the past, but now it has gotten to where her pain is activity  related and with weightbearing.  At this point, her left hip pain is detrimentally affecting her mobility, her quality of life and activities of daily living.  We have talked about the option of a hip replacement at this standpoint.  With this being  said, we discussed in detail the risk of acute blood loss anemia, nerve and vessel injury, fracture, infection, DVT, implant failure and skin and soft tissue issues as well as  dislocation.  We talked about our goals being decrease pain, improve mobility  and overall improve quality of life.  DESCRIPTION OF PROCEDURE:  After informed consent was obtained and appropriate left hip was marked, she was brought to the operating room and spinal anesthesia was obtained.  She was laid in supine position.  I assessed her leg lengths and found her leg  lengths to be equal.  We placed traction boots on both her feet.  Next, she was placed supine on the Hana fracture table with a perineal post in place and both legs in line skeletal traction device and no traction applied.  Her left operative hip was  prepped and draped with DuraPrep and sterile drapes.  A timeout was called and she was identified as correct patient, correct left hip.  We did assess her radiographically before making our prepping and draping as well, so we could get good preoperative  and postoperative films as well.  I then made an incision just inferior and posterior to the anterior superior iliac spine and carried this obliquely down the leg.  We dissected down tensor fascia lata muscle.  Tensor fascia was then divided  longitudinally to proceed with direct anterior approach to the hip.  We identified and cauterized circumflex vessels and then identified the hip capsule, opened the hip capsule in an L-type format and did not find any significant joint effusion.  Once we  opened up the joint capsule, we could see where there was labral repair that was done anterior and superior with several suture anchors.  We removed those and made our femoral neck cut with an oscillating saw just proximal to the lesser trochanter and  completed this with an osteotome.  We placed a corkscrew guide in the femoral head and removed the femoral head in its entirety and only found a small area devoid of cartilage, but it was at the weightbearing surface of the hip at the superolateral  aspect.  This was the same with the acetabulum.  We then  removed remaining remnants of acetabular labrum and other debris.  I placed a bent Hohmann over the medial acetabular rim and then began reaming under direct visualization from a size 43 reamer in  stepwise increments up to a size 50, with all reamers under direct visualization, the last reamer under direct fluoroscopy, so we could obtain our depth of reaming, our inclination and anteversion.  I then placed the real DePuy Sector Gription acetabular  component size 50 and a 32+0 neutral polyethylene liner for that size acetabular component.  Attention was then turned to the femur.  With the leg externally rotated to 120 degrees, extended and adducted, we were able to place the Mueller retractor  medially and Hohmann retractor behind the greater trochanter.  We released lateral joint capsule and used a box-cutting osteotome to enter the femoral canal and a rongeur to lateralize, then began broaching using the Corail broaching system, only have to  go up from an 8 to a 9.  She is very young and has nice strong cortical bone and it was very tight fit.  We placed the trial 9 femoral component with a standard offset femoral neck and a 32+1 hip ball.  We brought the leg back over and up and with  traction and internal rotation, reducing the pelvis and we assessed it mechanically and radiographically for stability as well as assessing offset and leg length and position of the stem.  We were pleased with the placement of the trial implants.  We  then dislocated the hip and removed the trial implants.  We placed the real Corail femoral component size 9 with standard offset and the real 32+1 ceramic hip ball and again reduced this in the acetabulum and it was stable.  We assessed it again  radiographically and mechanically.  We then irrigated the soft tissue with normal saline solution using pulsatile lavage.  We closed the joint capsule with interrupted #1 Ethibond suture, followed by #1 Vicryl to close the tensor  fascia, 0 Vicryl was  used to close deep tissue and 2-0 Vicryl was used to close subcutaneous tissue.  The skin was reapproximated with staples and Aquacel dressing was applied.  She was taken off the Hana table and in and out catheterization was performed.  She was taken to  recovery room in stable condition with all final counts being correct.  No complications noted.  Of note, Benita Stabile, PA-C, assisted during the entire case and assistance was crucial for facilitating all aspects of this case.  HN/NUANCE  D:07/11/2020 T:07/11/2020 JOB:[id number]

## 2020-07-11 NOTE — Anesthesia Procedure Notes (Signed)
Date/Time: 07/11/2020 7:21 AM Performed by: Cynda Familia, CRNA Pre-anesthesia Checklist: Patient identified, Emergency Drugs available, Suction available, Patient being monitored and Timeout performed Patient Re-evaluated:Patient Re-evaluated prior to induction Oxygen Delivery Method: Simple face mask Placement Confirmation: positive ETCO2 and breath sounds checked- equal and bilateral Dental Injury: Teeth and Oropharynx as per pre-operative assessment

## 2020-07-11 NOTE — Progress Notes (Signed)
Physical Therapy Treatment Patient Details Name: Alice Klein MRN: [id number] DOB: 1975/12/19 Today's Date: 07/11/2020    History of Present Illness Patient is 45 y.o. female s/p Lt THA anterior approach on 07/11/20 with PMH significant for SVT, RA, GERD, CKD, anxiety,    PT Comments    Patient seen for follow up therapy session in effort to discharge day of surgery. Patient with good carryover for sequencing bed mobility, transfers, and gait with RW. She continues to be limited to short gait 2/2 weakness and woozy sensation with upright activity. Pt noted to be orthostatic again with mobility. Unable to progress mobility to stair training due to hypotension. She will benefit from additional skilled PT interventions to progress mobility for safe discharge home. Acute PT will follow up tomorrow for continued therapy.   Follow Up Recommendations  Follow surgeon's recommendation for DC plan and follow-up therapies;Home health PT     Equipment Recommendations  None recommended by PT    Recommendations for Other Services       Precautions / Restrictions Precautions Precautions: Fall Restrictions Weight Bearing Restrictions: No Other Position/Activity Restrictions: WBAT    Mobility  Bed Mobility Overal bed mobility: Needs Assistance Bed Mobility: Supine to Sit;Sit to Supine     Supine to sit: Min guard;Supervision;HOB elevated Sit to supine: Min assist   General bed mobility comments: pt using belt to move to EOB at start of session. patient required min assist for Lt LE mobility to return to supine.  Transfers Overall transfer level: Needs assistance Equipment used: Rolling walker (2 wheeled) Transfers: Sit to/from [facility] Sit to Stand: Min guard;Min assist Stand pivot transfers: Min assist       General transfer comment: min guard for rise from EOB pt with safe hand placement on RW. patient requires min assist for transfer WC to EOB due to  hypotensive episode.  Ambulation/Gait Ambulation/Gait assistance: Min guard;Min assist Gait Distance (Feet): 20 Feet Assistive device: Rolling walker (2 wheeled) Gait Pattern/deviations: Step-to pattern;Decreased stride length Gait velocity: decr   General Gait Details: pt with good carryover for safe step pattern and proximity to RW. pt moving slowly. c/o clammy sensation and woozy/weak feeling, pt hypotensive with gait.   Stairs             Wheelchair Mobility    Modified Rankin (Stroke Patients Only)       Balance Overall balance assessment: Needs assistance Sitting-balance support: Feet supported Sitting balance-Leahy Scale: Good     Standing balance support: During functional activity;Bilateral upper extremity supported Standing balance-Leahy Scale: Poor                              Cognition Arousal/Alertness: Awake/alert Behavior During Therapy: WFL for tasks assessed/performed Overall Cognitive Status: Within Functional Limits for tasks assessed                                        Exercises      General Comments        Pertinent Vitals/Pain Pain Assessment: 0-10 Pain Score: 5  Faces Pain Scale: Hurts a little bit Pain Location: Lt hip Pain Descriptors / Indicators: Aching Pain Intervention(s): Limited activity within patient's tolerance;Monitored during session;Repositioned;Ice applied    Home Living Family/patient expects to be discharged to:: Private residence Living Arrangements: Spouse/significant other;Children Available Help at Discharge: Family  Type of Home: House Home Access: Stairs to enter Entrance Stairs-Rails: Right;Left Home Layout: Two level;Bed/bath upstairs Home Equipment: Walker - 2 wheels;Cane - single point;Crutches      Prior Function Level of Independence: Independent          PT Goals (current goals can now be found in the care plan section) Acute Rehab PT Goals Patient Stated  Goal: go home today PT Goal Formulation: With patient Time For Goal Achievement: 07/18/20 Potential to Achieve Goals: Good Progress towards PT goals: Progressing toward goals    Frequency    7X/week      PT Plan Current plan remains appropriate    Co-evaluation              AM-PAC PT "6 Clicks" Mobility   Outcome Measure  Help needed turning from your back to your side while in a flat bed without using bedrails?: None Help needed moving from lying on your back to sitting on the side of a flat bed without using bedrails?: A Little Help needed moving to and from a bed to a chair (including a wheelchair)?: A Little Help needed standing up from a chair using your arms (e.g., wheelchair or bedside chair)?: A Little Help needed to walk in hospital room?: A Little Help needed climbing 3-5 steps with a railing? : A Lot 6 Click Score: 18    End of Session Equipment Utilized During Treatment: Gait belt Activity Tolerance: Treatment limited secondary to medical complications (Comment) (orthostatic hypotension) Patient left: in bed;with call bell/phone within reach;with family/visitor present Nurse Communication: Mobility status PT Visit Diagnosis: Muscle weakness (generalized) (M62.81);Difficulty in walking, not elsewhere classified (R26.2)     Time: 6712-4580 PT Time Calculation (min) (ACUTE ONLY): 26 min  Charges:  $Gait Training: 8-22 mins $Therapeutic Activity: 8-22 mins                     Verner Mould, DPT Acute Rehabilitation Services Office [phone] Pager [phone]     Jacques Navy 07/11/2020, 4:20 PM

## 2020-07-11 NOTE — Brief Op Note (Signed)
07/11/2020  8:38 AM  PATIENT:  Alice Klein  45 y.o. female  PRE-OPERATIVE DIAGNOSIS:  Avascular Necrosis Left Hip  POST-OPERATIVE DIAGNOSIS:  Avascular Necrosis Left Hip  PROCEDURE:  Procedure(s): LEFT TOTAL HIP ARTHROPLASTY ANTERIOR APPROACH (Left)  SURGEON:  Surgeon(s) and Role:    Mcarthur Rossetti, MD - Primary  PHYSICIAN ASSISTANT:  Benita Stabile, PA-C  ANESTHESIA:   spinal  EBL:  350 mL   COUNTS:  YES  DICTATION: .Other Dictation: Dictation Number [id number]  PLAN OF CARE: Discharge to home after PACU  PATIENT DISPOSITION:  PACU - hemodynamically stable.   Delay start of Pharmacological VTE agent (>24hrs) due to surgical blood loss or risk of bleeding: no

## 2020-07-11 NOTE — Anesthesia Postprocedure Evaluation (Signed)
Anesthesia Post Note  Patient: Somara Frymire Yust  Procedure(s) Performed: LEFT TOTAL HIP ARTHROPLASTY ANTERIOR APPROACH (Left Hip)     Patient location during evaluation: PACU Anesthesia Type: Spinal Level of consciousness: oriented, awake and alert and awake Pain management: pain level controlled Vital Signs Assessment: post-procedure vital signs reviewed and stable Respiratory status: spontaneous breathing, respiratory function stable, patient connected to nasal cannula oxygen and nonlabored ventilation Cardiovascular status: blood pressure returned to baseline and stable Postop Assessment: no headache, no backache, no apparent nausea or vomiting, patient able to bend at knees and spinal receding Anesthetic complications: no   No complications documented.  Last Vitals:  Vitals:   07/11/20 1015 07/11/20 1030  BP: 114/69 117/68  Pulse: 69 72  Resp: 15 15  Temp:  36.8 C  SpO2: 100% 97%    Last Pain:  Vitals:   07/11/20 1030  TempSrc:   PainSc: 2                  Catalina Gravel

## 2020-07-11 NOTE — Transfer of Care (Signed)
Immediate Anesthesia Transfer of Care Note  Patient: Alice Klein  Procedure(s) Performed: LEFT TOTAL HIP ARTHROPLASTY ANTERIOR APPROACH (Left Hip)  Patient Location: PACU  Anesthesia Type:Spinal  Level of Consciousness: awake and alert   Airway & Oxygen Therapy: Patient Spontanous Breathing and Patient connected to face mask oxygen  Post-op Assessment: Report given to RN and Post -op Vital signs reviewed and stable  Post vital signs: Reviewed and stable  Last Vitals:  Vitals Value Taken Time  BP 113/56 07/11/20 0905  Temp    Pulse 97 07/11/20 0907  Resp 17 07/11/20 0907  SpO2 100 % 07/11/20 0907  Vitals shown include unvalidated device data.  Last Pain:  Vitals:   07/11/20 0613  TempSrc:   PainSc: 3       Patients Stated Pain Goal: 4 (74/94/49 6759)  Complications: No complications documented.

## 2020-07-11 NOTE — Evaluation (Signed)
Physical Therapy Evaluation Patient Details Name: Alice Klein MRN: [id number] DOB: Jul 07, 1975 Today's Date: 07/11/2020   History of Present Illness  Patient is 45 y.o. female s/p Lt THA anterior approach on 07/11/20 with PMH significant for SVT, RA, GERD, CKD, anxiety,    Clinical Impression  Alice Klein is a 45 y.o. female POD 0 s/p Lt THA. Patient reports independence with mobility at baseline. Patient is now limited by functional impairments (see PT problem list below) and requires min guard/assist for transfers and gait with RW. Patient was able to ambulate ~35 feet with RW and min guard/assist. She wa limited by symptomatic hypotension with mobility and BP noted to be 74/48 after gait. Pt required mod assist to return to bed and was placed in trendelenburg position. BP 95/49 (8 minutes after previous BP). Patient will benefit from continued skilled PT interventions to address impairments and progress towards PLOF. Acute PT will follow patient for additional session to progress mobility and stair training in preparation for safe discharge home.     Follow Up Recommendations Follow surgeon's recommendation for DC plan and follow-up therapies;Home health PT    Equipment Recommendations  None recommended by PT    Recommendations for Other Services       Precautions / Restrictions Precautions Precautions: Fall Restrictions Weight Bearing Restrictions: No Other Position/Activity Restrictions: WBAT      Mobility  Bed Mobility Overal bed mobility: Needs Assistance Bed Mobility: Supine to Sit;Sit to Supine     Supine to sit: Min guard;Supervision;HOB elevated Sit to supine: Mod assist;+2 for safety/equipment   General bed mobility comments: cues for use of belt to assist Lt LE off EOB, no assist required to scoot to EOB. Mod +2 assist to return to supine 2/2 hypotension.    Transfers Overall transfer level: Needs assistance Equipment used: Rolling walker (2  wheeled) Transfers: Sit to/from [facility] Sit to Stand: Min guard;Min assist Stand pivot transfers: Min assist;Mod assist;+2 safety/equipment       General transfer comment: Min guard for sit<>stand from EOB at start of session. cues for safe hand placement on RW. Min-Mod assist for stand pivot from WC to EOB after pt became hypotensive with mobiltiy.  Ambulation/Gait Ambulation/Gait assistance: Min guard;Min assist Gait Distance (Feet): 35 Feet Assistive device: Rolling walker (2 wheeled) Gait Pattern/deviations: Step-to pattern;Decreased stride length Gait velocity: decr   General Gait Details: cues for safe step pattern and proximity to RW, no overt LOB noted. pt became clamy with gait and c/o weak nauseous sensation. pt given seated rest and found to be hypotensive 74/48.  Stairs            Wheelchair Mobility    Modified Rankin (Stroke Patients Only)       Balance Overall balance assessment: Needs assistance Sitting-balance support: Feet supported Sitting balance-Leahy Scale: Good     Standing balance support: During functional activity;Bilateral upper extremity supported Standing balance-Leahy Scale: Poor                               Pertinent Vitals/Pain Pain Assessment: Faces Faces Pain Scale: Hurts a little bit Pain Location: Lt hip Pain Descriptors / Indicators: Aching Pain Intervention(s): Limited activity within patient's tolerance;Monitored during session;Repositioned;Ice applied    Home Living Family/patient expects to be discharged to:: Private residence Living Arrangements: Spouse/significant other;Children Available Help at Discharge: Family Type of Home: House Home Access: Stairs to enter Entrance Stairs-Rails: Audiological scientist  Stairs-Number of Steps: 8-10 Home Layout: Two level;Bed/bath upstairs Home Equipment: Walker - 2 wheels;Cane - single point;Crutches      Prior Function Level of Independence:  Independent               Hand Dominance   Dominant Hand: Right    Extremity/Trunk Assessment   Upper Extremity Assessment Upper Extremity Assessment: Overall WFL for tasks assessed    Lower Extremity Assessment Lower Extremity Assessment: Overall WFL for tasks assessed    Cervical / Trunk Assessment Cervical / Trunk Assessment: Normal  Communication   Communication: No difficulties  Cognition Arousal/Alertness: Awake/alert Behavior During Therapy: WFL for tasks assessed/performed Overall Cognitive Status: Within Functional Limits for tasks assessed                                        General Comments      Exercises     Assessment/Plan    PT Assessment Patient needs continued PT services  PT Problem List Decreased strength;Decreased range of motion;Decreased activity tolerance;Decreased balance;Decreased mobility;Decreased knowledge of use of DME;Decreased knowledge of precautions       PT Treatment Interventions DME instruction;Gait training;Stair training;Functional mobility training;Therapeutic activities;Therapeutic exercise;Balance training;Patient/family education    PT Goals (Current goals can be found in the Care Plan section)  Acute Rehab PT Goals Patient Stated Goal: go home today PT Goal Formulation: With patient Time For Goal Achievement: 07/18/20 Potential to Achieve Goals: Good    Frequency 7X/week   Barriers to discharge        Co-evaluation               AM-PAC PT "6 Clicks" Mobility  Outcome Measure Help needed turning from your back to your side while in a flat bed without using bedrails?: None Help needed moving from lying on your back to sitting on the side of a flat bed without using bedrails?: A Little Help needed moving to and from a bed to a chair (including a wheelchair)?: A Little Help needed standing up from a chair using your arms (e.g., wheelchair or bedside chair)?: A Little Help needed to walk  in hospital room?: A Little Help needed climbing 3-5 steps with a railing? : A Lot 6 Click Score: 18    End of Session Equipment Utilized During Treatment: Gait belt Activity Tolerance: Treatment limited secondary to medical complications (Comment) (orthostatic hypotension) Patient left: in bed;with call bell/phone within reach;with family/visitor present Nurse Communication: Mobility status PT Visit Diagnosis: Muscle weakness (generalized) (M62.81);Difficulty in walking, not elsewhere classified (R26.2)    Time: 0263-7858 PT Time Calculation (min) (ACUTE ONLY): 29 min   Charges:   PT Evaluation $PT Eval Low Complexity: 1 Low PT Treatments $Gait Training: 8-22 mins        Verner Mould, DPT Acute Rehabilitation Services Office [phone] Pager [phone]    Jacques Navy 07/11/2020, 1:48 PM

## 2020-07-11 NOTE — Discharge Instructions (Signed)

## 2020-07-11 NOTE — Interval H&P Note (Signed)
History and Physical Interval Note: The patient understands that she is here today for a left total hip arthroplasty to treat the osteoarthritis of her left hip.  There has been no acute change in her medical status.  See recent H&P.  The risks and benefits of surgery have been explained in detail and informed consent is obtained.  The left hip has been marked.  07/11/2020 7:04 AM  Alice Klein  has presented today for surgery, with the diagnosis of Avascular Necrosis Left Hip.  The various methods of treatment have been discussed with the patient and family. After consideration of risks, benefits and other options for treatment, the patient has consented to  Procedure(s): LEFT TOTAL HIP ARTHROPLASTY ANTERIOR APPROACH (Left) as a surgical intervention.  The patient's history has been reviewed, patient examined, no change in status, stable for surgery.  I have reviewed the patient's chart and labs.  Questions were answered to the patient's satisfaction.     Mcarthur Rossetti

## 2020-07-11 NOTE — Telephone Encounter (Signed)
[employer] forms received. Sent to [employer]. 

## 2020-07-11 NOTE — Anesthesia Procedure Notes (Signed)
Spinal  Patient location during procedure: OR Start time: 07/11/2020 7:24 AM End time: 07/11/2020 7:28 AM Staffing Performed: anesthesiologist  Anesthesiologist: Catalina Gravel, MD Preanesthetic Checklist Completed: patient identified, IV checked, risks and benefits discussed, surgical consent, monitors and equipment checked, pre-op evaluation and timeout performed Spinal Block Patient position: sitting Prep: DuraPrep and site prepped and draped Patient monitoring: continuous pulse ox and blood pressure Approach: midline Location: L3-4 Injection technique: single-shot Needle Needle type: Pencan  Needle gauge: 24 G Assessment Sensory level: T6 Additional Notes Functioning IV was confirmed and monitors were applied. Sterile prep and drape, including hand hygiene, mask and sterile gloves were used. The patient was positioned and the spine was prepped. The skin was anesthetized with lidocaine.  Free flow of clear CSF was obtained prior to injecting local anesthetic into the CSF.  The spinal needle aspirated freely following injection.  The needle was carefully withdrawn.  The patient tolerated the procedure well. Consent was obtained prior to procedure with all questions answered and concerns addressed. Risks including but not limited to bleeding, infection, nerve damage, paralysis, failed block, inadequate analgesia, allergic reaction, high spinal, itching and headache were discussed and the patient wished to proceed.   Hoy Morn, MD

## 2020-07-12 ENCOUNTER — Other Ambulatory Visit (HOSPITAL_COMMUNITY): Payer: Self-pay | Admitting: Orthopedic Surgery

## 2020-07-12 DIAGNOSIS — M1612 Unilateral primary osteoarthritis, left hip: Secondary | ICD-10-CM | POA: Diagnosis not present

## 2020-07-12 LAB — CBC
HCT: 29.8 % — ABNORMAL LOW (ref 36.0–46.0)
Hemoglobin: 10.2 g/dL — ABNORMAL LOW (ref 12.0–15.0)
MCH: 32.6 pg (ref 26.0–34.0)
MCHC: 34.2 g/dL (ref 30.0–36.0)
MCV: 95.2 fL (ref 80.0–100.0)
Platelets: 175 10*3/uL (ref 150–400)
RBC: 3.13 MIL/uL — ABNORMAL LOW (ref 3.87–5.11)
RDW: 13 % (ref 11.5–15.5)
WBC: 12.6 10*3/uL — ABNORMAL HIGH (ref 4.0–10.5)
nRBC: 0 % (ref 0.0–0.2)

## 2020-07-12 MED ORDER — METHOCARBAMOL 500 MG PO TABS: 500.0000 mg | ORAL_TABLET | Freq: Four times a day (QID) | ORAL | 0 refills | Status: DC | PRN

## 2020-07-12 MED ORDER — ACETAMINOPHEN 325 MG PO TABS: 325.0000 mg | ORAL_TABLET | Freq: Four times a day (QID) | ORAL | 0 refills | Status: DC | PRN

## 2020-07-12 MED ORDER — OXYCODONE HCL 10 MG PO TABS: 10.0000 mg | ORAL_TABLET | ORAL | 0 refills | Status: DC | PRN

## 2020-07-12 NOTE — Progress Notes (Signed)
  Subjective: Patient stable.  No orthostatic hypotension when she has been getting up during the night to go to the bathroom.  She did have some with her earliest attempt to get up and around yesterday.   Objective: Vital signs in last 24 hours: Temp:  [97.5 F (36.4 C)-98.4 F (36.9 C)] 98.2 F (36.8 C) (01/08 5038) Pulse Rate:  [64-94] 64 (01/08 0608) Resp:  [14-19] 16 (01/08 0608) BP: (97-120)/(53-82) 108/63 (01/08 0608) SpO2:  [96 %-100 %] 100 % (01/08 8828)  Intake/Output from previous day: 01/07 0701 - 01/08 0700 In: 4485.8 [P.O.:480; I.V.:3405.8; IV Piggyback:600] Out: 550 [Urine:200; Blood:350] Intake/Output this shift: No intake/output data recorded.  Exam:  Sensation intact distally Dorsiflexion/Plantar flexion intact Compartment soft  Labs: Recent Labs    07/12/20 0307  HGB 10.2*   Recent Labs    07/12/20 0307  WBC 12.6*  RBC 3.13*  HCT 29.8*  PLT 175   No results for input(s): NA, K, CL, CO2, BUN, CREATININE, GLUCOSE, CALCIUM in the last 72 hours. No results for input(s): LABPT, INR in the last 72 hours.  Assessment/Plan: Plan at this time is physical therapy this morning.  Anticipate discharge around noon.  She has her husband here who can help her to mobilize.   Landry Dyke Lily Kernen 07/12/2020, 8:45 AM

## 2020-07-12 NOTE — Plan of Care (Signed)
Pt ready for DC home with husband 

## 2020-07-12 NOTE — Progress Notes (Signed)
Physical Therapy Treatment Patient Details Name: Alice Klein MRN: [id number] DOB: 10-Feb-1976 Today's Date: 07/12/2020    History of Present Illness Patient is 45 y.o. female s/p Lt THA anterior approach on 07/11/20 with PMH significant for SVT, RA, GERD, CKD, anxiety,    PT Comments    Pt is POD # 1 and is progressing well.  Pt demonstrates safe gait & transfers in order to return home from PT perspective once discharged by MD.  She was able to ambulate and perform stairs without syncopal/hypotensive episode.  Pt with good understanding of safety and rehab process. While in hospital, will continue to benefit from PT for skilled therapy to advance mobility and exercises.      Follow Up Recommendations  Follow surgeon's recommendation for DC plan and follow-up therapies     Equipment Recommendations  None recommended by PT    Recommendations for Other Services       Precautions / Restrictions Precautions Precautions: Fall Restrictions Other Position/Activity Restrictions: WBAT    Mobility  Bed Mobility Overal bed mobility: Needs Assistance Bed Mobility: Supine to Sit;Sit to Supine     Supine to sit: Supervision Sit to supine: Min assist   General bed mobility comments: Used gait belt to assist L leg out of bed; min A and gait belt to get leg back in bed  Transfers Overall transfer level: Needs assistance Equipment used: Rolling walker (2 wheeled) Transfers: Sit to/from Stand Sit to Stand: Supervision            Ambulation/Gait Ambulation/Gait assistance: Min guard;Supervision Gait Distance (Feet): 200 Feet Assistive device: Rolling walker (2 wheeled) Gait Pattern/deviations: Step-to pattern;Decreased stride length;Decreased weight shift to left Gait velocity: decr   General Gait Details: Good safety and RW proximity; educated on increased heel strike and foot clearance as able; no hypotension today   Stairs Stairs: Yes Stairs assistance: Min  guard Stair Management: One rail Right;Forwards;Step to pattern (1 rail and HHA) Number of Stairs: 5 General stair comments: cues for sequence ; performed safely   Wheelchair Mobility    Modified Rankin (Stroke Patients Only)       Balance Overall balance assessment: Needs assistance Sitting-balance support: Feet supported Sitting balance-Leahy Scale: Normal     Standing balance support: Bilateral upper extremity supported;No upper extremity supported Standing balance-Leahy Scale: Good Standing balance comment: RW ambulation; able to do ADLs without UE support                            Cognition Arousal/Alertness: Awake/alert Behavior During Therapy: WFL for tasks assessed/performed Overall Cognitive Status: Within Functional Limits for tasks assessed                                        Exercises Total Joint Exercises Ankle Circles/Pumps: AROM;Both;10 reps;Supine Quad Sets: AROM;Left;10 reps;Supine Heel Slides: AAROM;Left;10 reps;Supine (educated on use of gait belt and only to tolerance) Hip ABduction/ADduction: AAROM;Left;10 reps;Supine (educated on use of gait belt and only to tolerance) Long Arc Quad: AROM;Left;10 reps;Seated (cues for thigh fully supported)    General Comments General comments (skin integrity, edema, etc.): Educated on safe ice use, car transfers, no pivots, and HEP.  BP was 123/63 with standing      Pertinent Vitals/Pain Pain Assessment: 0-10 Pain Score: 4  Pain Location: Lt hip Pain Descriptors / Indicators: Aching Pain Intervention(s): Limited  activity within patient's tolerance;Monitored during session;Premedicated before session;Ice applied    Home Living                      Prior Function            PT Goals (current goals can now be found in the care plan section) Acute Rehab PT Goals Patient Stated Goal: go home today PT Goal Formulation: With patient/family Time For Goal Achievement:  07/18/20 Potential to Achieve Goals: Good Progress towards PT goals: Progressing toward goals    Frequency    7X/week      PT Plan Current plan remains appropriate    Co-evaluation              AM-PAC PT "6 Clicks" Mobility   Outcome Measure  Help needed turning from your back to your side while in a flat bed without using bedrails?: None Help needed moving from lying on your back to sitting on the side of a flat bed without using bedrails?: None Help needed moving to and from a bed to a chair (including a wheelchair)?: A Little Help needed standing up from a chair using your arms (e.g., wheelchair or bedside chair)?: A Little Help needed to walk in hospital room?: A Little Help needed climbing 3-5 steps with a railing? : A Little 6 Click Score: 20    End of Session Equipment Utilized During Treatment: Gait belt Activity Tolerance: Patient tolerated treatment well Patient left: in bed;with call bell/phone within reach;with family/visitor present Nurse Communication: Mobility status PT Visit Diagnosis: Muscle weakness (generalized) (M62.81);Difficulty in walking, not elsewhere classified (R26.2)     Time: 2694-8546 PT Time Calculation (min) (ACUTE ONLY): 35 min  Charges:  $Gait Training: 8-22 mins $Therapeutic Exercise: 8-22 mins                     Abran Richard, PT Acute Rehab Services Pager [phone] [facility] 07/12/2020, 9:50 AM

## 2020-07-12 NOTE — TOC Progression Note (Signed)
Transition of Care [facility]) - Progression Note    Patient Details  Name: Alice Klein MRN: [id number] Date of Birth: 1976-05-08  [facility]) CM/SW Contact  Joaquin Courts, RN Phone Number: 07/12/2020, 10:13 AM  Clinical Narrative:    CM spoke with patient who reports she has rolling walker at home and feels does not need 3in1, reports bathroom is safely accessible.  [facility] to provide HHPT services.   Expected Discharge Plan: [place] Barriers to Discharge: No Barriers Identified  Expected Discharge Plan and Services Expected Discharge Plan: [place]   Discharge Planning Services: CM Consult Post Acute Care Choice: [place] arrangements for the past 2 months: Single Family Home Expected Discharge Date: 07/12/20               DME Arranged: N/A DME Agency: NA       HH Arranged: PT HH Agency: [facility] [employer])     Representative spoke with at [place]: pre-arranged in md office   Social Determinants of Health ([place]) Interventions    Readmission Risk Interventions No flowsheet data found.

## 2020-07-13 DIAGNOSIS — K219 Gastro-esophageal reflux disease without esophagitis: Secondary | ICD-10-CM | POA: Diagnosis not present

## 2020-07-13 DIAGNOSIS — F419 Anxiety disorder, unspecified: Secondary | ICD-10-CM | POA: Diagnosis not present

## 2020-07-13 DIAGNOSIS — Z96642 Presence of left artificial hip joint: Secondary | ICD-10-CM | POA: Diagnosis not present

## 2020-07-13 DIAGNOSIS — Z471 Aftercare following joint replacement surgery: Secondary | ICD-10-CM | POA: Diagnosis not present

## 2020-07-13 DIAGNOSIS — M0600 Rheumatoid arthritis without rheumatoid factor, unspecified site: Secondary | ICD-10-CM | POA: Diagnosis not present

## 2020-07-13 DIAGNOSIS — I471 Supraventricular tachycardia: Secondary | ICD-10-CM | POA: Diagnosis not present

## 2020-07-13 DIAGNOSIS — Z7982 Long term (current) use of aspirin: Secondary | ICD-10-CM | POA: Diagnosis not present

## 2020-07-13 DIAGNOSIS — N189 Chronic kidney disease, unspecified: Secondary | ICD-10-CM | POA: Diagnosis not present

## 2020-07-13 DIAGNOSIS — J45909 Unspecified asthma, uncomplicated: Secondary | ICD-10-CM | POA: Diagnosis not present

## 2020-07-14 ENCOUNTER — Encounter (HOSPITAL_COMMUNITY): Payer: Self-pay | Admitting: Orthopaedic Surgery

## 2020-07-14 NOTE — Discharge Summary (Signed)
Patient ID: Diahanna Barbaro Silfies MRN: [id number] DOB/AGE: 09-17-1975 45 y.o.  Admit date: 07/11/2020 Discharge date: 07/14/2020  Admission Diagnoses:  Principal Problem:   Unilateral primary osteoarthritis, left hip Active Problems:   Status post hip replacement   Discharge Diagnoses:  Same  Past Medical History:  Diagnosis Date  . Allergies   . Anxiety   . Chronic kidney disease    uritral re-implantation  . GERD (gastroesophageal reflux disease)   . Headache   . Palpitations 08/23/2019  . Pneumonia   . Reactive airway disease   . Seronegative rheumatoid arthritis ([place])   . SVT (supraventricular tachycardia) ([place])   . UTI (urinary tract infection)     Surgeries: Procedure(s): LEFT TOTAL HIP ARTHROPLASTY ANTERIOR APPROACH on 07/11/2020   Consultants:   Discharged Condition: Improved  Hospital Course: Angala Hanscom Morain is an 45 y.o. female who was admitted 07/11/2020 for operative treatment ofUnilateral primary osteoarthritis, left hip. Patient has severe unremitting pain that affects sleep, daily activities, and work/hobbies. After pre-op clearance the patient was taken to the operating room on 07/11/2020 and underwent  Procedure(s): LEFT TOTAL HIP ARTHROPLASTY ANTERIOR APPROACH.    Patient was given perioperative antibiotics:  Anti-infectives (From admission, onward)   Start     Dose/Rate Route Frequency Ordered Stop   07/11/20 1330  clindamycin (CLEOCIN) IVPB 600 mg        600 mg 100 mL/hr over 30 Minutes Intravenous Every 6 hours 07/11/20 0909 07/11/20 1400   07/11/20 1322  clindamycin (CLEOCIN) 900 MG/50ML IVPB       Note to Pharmacy: Bernadene Person   : cabinet override      07/11/20 1322 07/11/20 1552   07/11/20 1318  clindamycin (CLEOCIN) 900 MG/50ML IVPB       Note to Pharmacy: Bernadene Person   : cabinet override      07/11/20 1318 07/11/20 1350   07/11/20 0600  clindamycin (CLEOCIN) IVPB 900 mg        900 mg 100 mL/hr over 30 Minutes Intravenous On call to O.R.  07/11/20 0530 07/11/20 0729       Patient was given sequential compression devices, early ambulation, and chemoprophylaxis to prevent DVT.  Patient benefited maximally from hospital stay and there were no complications.    Recent vital signs: No data found.   Recent laboratory studies:  Recent Labs    07/12/20 0307  WBC 12.6*  HGB 10.2*  HCT 29.8*  PLT 175     Discharge Medications:   Allergies as of 07/12/2020      Reactions   Clarithromycin Shortness Of Breath   Penicillin G Other (See Comments)   unknown   Latex Rash   hives      Medication List    STOP taking these medications   cyclobenzaprine 10 MG tablet Commonly known as: FLEXERIL   Humira Pen 40 MG/0.4ML Pnkt Generic drug: Adalimumab   hydroxychloroquine 200 MG tablet Commonly known as: PLAQUENIL   VITAMIN E PO     TAKE these medications   acetaminophen 325 MG tablet Commonly known as: TYLENOL Take 1-2 tablets (325-650 mg total) by mouth every 6 (six) hours as needed for mild pain (pain score 1-3 or temp > 100.5).   ALPRAZolam 0.5 MG tablet Commonly known as: XANAX Take 0.5 mg by mouth at bedtime as needed for anxiety.   aspirin 81 MG chewable tablet Commonly known as: Aspirin Childrens Chew 1 tablet (81 mg total) by mouth in the morning and at bedtime.  B-12 PO Take 180 mcg by mouth daily.   B-6 PO Take 8.5 mg by mouth daily.   BIOTIN PO Take 240 mcg by mouth daily.   CALCIUM-VITAMIN D PO Take 1 tablet by mouth daily.   celecoxib 200 MG capsule Commonly known as: CELEBREX Take 200 mg by mouth 2 (two) times daily.   fluticasone 50 MCG/ACT nasal spray Commonly known as: FLONASE Place 2 sprays into both nostrils daily.   FOLATE PO Take 400 mcg by mouth daily.   IODINE (KELP) PO Take 150 mcg by mouth daily.   IRON PO Take 2.67 mg by mouth daily.   methocarbamol 500 MG tablet Commonly known as: ROBAXIN Take 1 tablet (500 mg total) by mouth every 6 (six) hours as needed for  muscle spasms.   metoprolol succinate 25 MG 24 hr tablet Commonly known as: Toprol XL Take 1 tablet (25 mg total) by mouth daily.   ondansetron 4 MG disintegrating tablet Commonly known as: Zofran ODT Take 1 tablet (4 mg total) by mouth every 8 (eight) hours as needed for nausea or vomiting.   Oxycodone HCl 10 MG Tabs Take 1-1.5 tablets (10-15 mg total) by mouth every 4 (four) hours as needed for severe pain (pain score 7-10).   PROBIOTIC DAILY PO Take 1 capsule by mouth daily.   THIAMINE PO Take 4.8 mg by mouth daily.   traMADol 50 MG tablet Commonly known as: ULTRAM Take 50 mg by mouth every 8 (eight) hours as needed.   VITAMIN A PO Take 450 mcg by mouth daily.   VITAMIN C PO Take 180 mg by mouth daily.   VITAMIN D PO Take 40 mcg by mouth daily.       Diagnostic Studies: DG Pelvis Portable  Result Date: 07/11/2020 CLINICAL DATA:  Post LEFT hip arthroplasty EXAM: PORTABLE PELVIS 1-2 VIEWS COMPARISON:  Portable exam 0932 hours compared to intraoperative images of 07/11/2020 FINDINGS: LEFT hip prosthesis in expected position. No fracture, dislocation or bone destruction identified on single AP view. Expected postsurgical changes of the soft tissues with overlying skin clips. IMPRESSION: LEFT hip prosthesis without acute complication on single AP view. Electronically Signed   By: Lavonia Dana M.D.   On: 07/11/2020 09:49   DG C-Arm 1-60 Min-No Report  Result Date: 07/11/2020 Fluoroscopy was utilized by the requesting physician.  No radiographic interpretation.   DG HIP OPERATIVE UNILAT W OR W/O PELVIS LEFT  Result Date: 07/11/2020 CLINICAL DATA:  Intraoperative images of left anterior hip replacement EXAM: OPERATIVE left HIP (WITH PELVIS IF PERFORMED) 2 VIEWS TECHNIQUE: Fluoroscopic spot image(s) were submitted for interpretation post-operatively. COMPARISON:  04/02/2020 left hip radiographs FINDINGS: Submitted images demonstrate left total hip prosthesis. Subcutaneous emphysema  consistent with intraoperative status. IMPRESSION: Intraoperative fluoroscopic images of the left hip replacement as above. Electronically Signed   By: Miachel Roux M.D.   On: 07/11/2020 08:47    Disposition: Discharge disposition: 01-Home or Self Care       Discharge Instructions    Call MD / Call 911   Complete by: As directed    If you experience chest pain or shortness of breath, CALL 911 and be transported to the hospital emergency room.  If you develope a fever above 101 F, pus (white drainage) or increased drainage or redness at the wound, or calf pain, call your surgeon's office.   Constipation Prevention   Complete by: As directed    Drink plenty of fluids.  Prune juice may be helpful.  You may use a stool softener, such as Colace (over the counter) 100 mg twice a day.  Use MiraLax (over the counter) for constipation as needed.   Diet - low sodium heart healthy   Complete by: As directed    Discharge instructions   Complete by: As directed    Follow-up with Dr. Ninfa Linden in 2 weeks   Increase activity slowly as tolerated   Complete by: As directed        Follow-up Information    Mcarthur Rossetti, MD In 2 weeks.   Specialty: Orthopedic Surgery Contact information: [place] [address] [phone]        [facility] up.   Specialty: [place] Why: agency will provide home health physical therapy Contact information: [place] [address] [phone]                Signed: Mcarthur Rossetti 07/14/2020, 3:27 PM

## 2020-07-16 DIAGNOSIS — K219 Gastro-esophageal reflux disease without esophagitis: Secondary | ICD-10-CM | POA: Diagnosis not present

## 2020-07-16 DIAGNOSIS — Z471 Aftercare following joint replacement surgery: Secondary | ICD-10-CM | POA: Diagnosis not present

## 2020-07-16 DIAGNOSIS — F419 Anxiety disorder, unspecified: Secondary | ICD-10-CM | POA: Diagnosis not present

## 2020-07-16 DIAGNOSIS — J45909 Unspecified asthma, uncomplicated: Secondary | ICD-10-CM | POA: Diagnosis not present

## 2020-07-16 DIAGNOSIS — I471 Supraventricular tachycardia: Secondary | ICD-10-CM | POA: Diagnosis not present

## 2020-07-16 DIAGNOSIS — N189 Chronic kidney disease, unspecified: Secondary | ICD-10-CM | POA: Diagnosis not present

## 2020-07-16 DIAGNOSIS — Z96642 Presence of left artificial hip joint: Secondary | ICD-10-CM | POA: Diagnosis not present

## 2020-07-16 DIAGNOSIS — Z7982 Long term (current) use of aspirin: Secondary | ICD-10-CM | POA: Diagnosis not present

## 2020-07-16 DIAGNOSIS — M0600 Rheumatoid arthritis without rheumatoid factor, unspecified site: Secondary | ICD-10-CM | POA: Diagnosis not present

## 2020-07-18 DIAGNOSIS — Z471 Aftercare following joint replacement surgery: Secondary | ICD-10-CM | POA: Diagnosis not present

## 2020-07-18 DIAGNOSIS — Z7982 Long term (current) use of aspirin: Secondary | ICD-10-CM | POA: Diagnosis not present

## 2020-07-18 DIAGNOSIS — N189 Chronic kidney disease, unspecified: Secondary | ICD-10-CM | POA: Diagnosis not present

## 2020-07-18 DIAGNOSIS — J45909 Unspecified asthma, uncomplicated: Secondary | ICD-10-CM | POA: Diagnosis not present

## 2020-07-18 DIAGNOSIS — M0600 Rheumatoid arthritis without rheumatoid factor, unspecified site: Secondary | ICD-10-CM | POA: Diagnosis not present

## 2020-07-18 DIAGNOSIS — I471 Supraventricular tachycardia: Secondary | ICD-10-CM | POA: Diagnosis not present

## 2020-07-18 DIAGNOSIS — F419 Anxiety disorder, unspecified: Secondary | ICD-10-CM | POA: Diagnosis not present

## 2020-07-18 DIAGNOSIS — Z96642 Presence of left artificial hip joint: Secondary | ICD-10-CM | POA: Diagnosis not present

## 2020-07-18 DIAGNOSIS — K219 Gastro-esophageal reflux disease without esophagitis: Secondary | ICD-10-CM | POA: Diagnosis not present

## 2020-07-22 ENCOUNTER — Telehealth: Payer: Self-pay | Admitting: Orthopaedic Surgery

## 2020-07-22 DIAGNOSIS — I471 Supraventricular tachycardia: Secondary | ICD-10-CM | POA: Diagnosis not present

## 2020-07-22 DIAGNOSIS — M0600 Rheumatoid arthritis without rheumatoid factor, unspecified site: Secondary | ICD-10-CM | POA: Diagnosis not present

## 2020-07-22 DIAGNOSIS — F419 Anxiety disorder, unspecified: Secondary | ICD-10-CM | POA: Diagnosis not present

## 2020-07-22 DIAGNOSIS — N189 Chronic kidney disease, unspecified: Secondary | ICD-10-CM | POA: Diagnosis not present

## 2020-07-22 DIAGNOSIS — Z471 Aftercare following joint replacement surgery: Secondary | ICD-10-CM | POA: Diagnosis not present

## 2020-07-22 DIAGNOSIS — Z7982 Long term (current) use of aspirin: Secondary | ICD-10-CM | POA: Diagnosis not present

## 2020-07-22 DIAGNOSIS — J45909 Unspecified asthma, uncomplicated: Secondary | ICD-10-CM | POA: Diagnosis not present

## 2020-07-22 DIAGNOSIS — K219 Gastro-esophageal reflux disease without esophagitis: Secondary | ICD-10-CM | POA: Diagnosis not present

## 2020-07-22 DIAGNOSIS — Z96642 Presence of left artificial hip joint: Secondary | ICD-10-CM | POA: Diagnosis not present

## 2020-07-22 NOTE — Telephone Encounter (Signed)
[employer] forms received. Sent to [employer]. 

## 2020-07-23 DIAGNOSIS — M0600 Rheumatoid arthritis without rheumatoid factor, unspecified site: Secondary | ICD-10-CM | POA: Diagnosis not present

## 2020-07-23 DIAGNOSIS — F419 Anxiety disorder, unspecified: Secondary | ICD-10-CM | POA: Diagnosis not present

## 2020-07-23 DIAGNOSIS — Z96642 Presence of left artificial hip joint: Secondary | ICD-10-CM | POA: Diagnosis not present

## 2020-07-23 DIAGNOSIS — N189 Chronic kidney disease, unspecified: Secondary | ICD-10-CM | POA: Diagnosis not present

## 2020-07-23 DIAGNOSIS — K219 Gastro-esophageal reflux disease without esophagitis: Secondary | ICD-10-CM | POA: Diagnosis not present

## 2020-07-23 DIAGNOSIS — J45909 Unspecified asthma, uncomplicated: Secondary | ICD-10-CM | POA: Diagnosis not present

## 2020-07-23 DIAGNOSIS — Z471 Aftercare following joint replacement surgery: Secondary | ICD-10-CM | POA: Diagnosis not present

## 2020-07-23 DIAGNOSIS — Z7982 Long term (current) use of aspirin: Secondary | ICD-10-CM | POA: Diagnosis not present

## 2020-07-23 DIAGNOSIS — I471 Supraventricular tachycardia: Secondary | ICD-10-CM | POA: Diagnosis not present

## 2020-07-24 ENCOUNTER — Ambulatory Visit (INDEPENDENT_AMBULATORY_CARE_PROVIDER_SITE_OTHER): Payer: 59 | Admitting: Orthopaedic Surgery

## 2020-07-24 ENCOUNTER — Encounter: Payer: Self-pay | Admitting: Orthopaedic Surgery

## 2020-07-24 ENCOUNTER — Other Ambulatory Visit: Payer: Self-pay | Admitting: Orthopaedic Surgery

## 2020-07-24 DIAGNOSIS — Z96642 Presence of left artificial hip joint: Secondary | ICD-10-CM

## 2020-07-24 MED ORDER — NITROFURANTOIN MACROCRYSTAL 50 MG PO CAPS: 50.0000 mg | ORAL_CAPSULE | Freq: Four times a day (QID) | ORAL | 0 refills | Status: DC

## 2020-07-24 MED ORDER — TRAMADOL HCL 50 MG PO TABS: 50.0000 mg | ORAL_TABLET | Freq: Four times a day (QID) | ORAL | 0 refills | Status: DC | PRN

## 2020-07-24 MED FILL — traMADol HCL 50 MG TABS: 50 | 5 days supply | Qty: 40 | Fill #0

## 2020-07-24 MED FILL — NITROFURANTOIN MCR 50 MG CA: 50 | 7 days supply | Qty: 28 | Fill #0

## 2020-07-24 NOTE — Progress Notes (Signed)
She is 2 weeks tomorrow status post a left total hip arthroplasty.  She is doing well overall.  She has been on a baby aspirin twice a day.  I told her she can get on a once a day for a week and then can stop that.  She denies any calf pain.  She is ambulate with a walker and plans to transition to a cane tomorrow.  She has been having symptoms of UTI and this is common for her after catheterizations.  She has been on Macrodantin in the past and would like to have this for UTI symptoms.  Overall though she denies any fever and chills and reports that she is doing well.  Examination of her left hip shows a healed incision.  Remove the staples in place Steri-Strips.  Her leg lengths feel equal.  She looks good overall.  She will slowly increase her activities as comfort allows.  We will send in some tramadol for pain and the Macrodantin.  All questions and concerns were answered and addressed.  We will see her back in 4 weeks to see how she is doing overall.  No x-rays are needed at that visit.

## 2020-07-26 DIAGNOSIS — K219 Gastro-esophageal reflux disease without esophagitis: Secondary | ICD-10-CM | POA: Diagnosis not present

## 2020-07-26 DIAGNOSIS — I471 Supraventricular tachycardia: Secondary | ICD-10-CM | POA: Diagnosis not present

## 2020-07-26 DIAGNOSIS — Z96642 Presence of left artificial hip joint: Secondary | ICD-10-CM | POA: Diagnosis not present

## 2020-07-26 DIAGNOSIS — M0600 Rheumatoid arthritis without rheumatoid factor, unspecified site: Secondary | ICD-10-CM | POA: Diagnosis not present

## 2020-07-26 DIAGNOSIS — Z7982 Long term (current) use of aspirin: Secondary | ICD-10-CM | POA: Diagnosis not present

## 2020-07-26 DIAGNOSIS — N189 Chronic kidney disease, unspecified: Secondary | ICD-10-CM | POA: Diagnosis not present

## 2020-07-26 DIAGNOSIS — F419 Anxiety disorder, unspecified: Secondary | ICD-10-CM | POA: Diagnosis not present

## 2020-07-26 DIAGNOSIS — Z471 Aftercare following joint replacement surgery: Secondary | ICD-10-CM | POA: Diagnosis not present

## 2020-07-26 DIAGNOSIS — J45909 Unspecified asthma, uncomplicated: Secondary | ICD-10-CM | POA: Diagnosis not present

## 2020-08-21 ENCOUNTER — Encounter: Payer: Self-pay | Admitting: Orthopaedic Surgery

## 2020-08-21 ENCOUNTER — Ambulatory Visit (INDEPENDENT_AMBULATORY_CARE_PROVIDER_SITE_OTHER): Payer: 59 | Admitting: Orthopaedic Surgery

## 2020-08-21 DIAGNOSIS — Z96642 Presence of left artificial hip joint: Secondary | ICD-10-CM

## 2020-08-21 NOTE — Progress Notes (Signed)
The patient comes in today about 6 weeks status post a left total hip arthroplasty.  She is still walking with a slight limp and having some spasms.  She works as a Marine scientist in the pediatric section so that requires her to be adequate on her feet quite a bit and being able to get up quickly.  I do not feel that she is ready to go back to work the 22nd February.  On exam her incision looks good.  She is developing a little bit of a keloid so I recommended any type of lotion she wants to try on her incision.  She can massage it as well.  Her leg lengths are equal.  There is no significant seroma.  She will continue to increase her activities as comfort allows.  I want her to rehab her hip and strengthening.  We need to give her a note to keep her out of a field trip to [place] on March 8 because that is going to require too much walking in a long bus ride and only she is ready for that and medically cleared for that.  I gave her a new work note to allow her to return to work March 13.  I will see her back in 4 weeks to see how she is doing overall but no x-rays are needed.

## 2020-09-18 ENCOUNTER — Encounter: Payer: Self-pay | Admitting: Physician Assistant

## 2020-09-18 ENCOUNTER — Other Ambulatory Visit: Payer: Self-pay

## 2020-09-18 ENCOUNTER — Ambulatory Visit (INDEPENDENT_AMBULATORY_CARE_PROVIDER_SITE_OTHER): Payer: 59 | Admitting: Physician Assistant

## 2020-09-18 DIAGNOSIS — Z96642 Presence of left artificial hip joint: Secondary | ICD-10-CM

## 2020-09-18 NOTE — Progress Notes (Signed)
HPI: Alice Klein returns today almost 10 weeks status post left total hip arthroplasty.  She is overall doing well she has some numbness pain with her healing proximal incision. Incision itching.  She states that she is working on scar is still slightly raised.  She has gone back to work as of this last Wednesday.  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  She is going to the gym 3-4 times a week mostly doing cardio on a treadmill.  Review of systems: No fevers chills.  Physical exam: Left hip good range of motion still lacks full external rotation.  Calf supple nontender.  She ambulates without any assistive device and a nonantalgic gait.  Impression: Status post left total hip arthroplasty 07/11/2020  Plan: She will continue to work on range of motion strengthening.  Work on TEFL teacher.  See her back in 6 months sooner if there is any questions concerns.  Questions were encouraged and answered at length.  AP pelvis and lateral view of the left hip at that time.

## 2020-09-22 ENCOUNTER — Other Ambulatory Visit (HOSPITAL_COMMUNITY): Payer: Self-pay | Admitting: Family Medicine

## 2020-10-02 ENCOUNTER — Other Ambulatory Visit (HOSPITAL_COMMUNITY): Payer: Self-pay

## 2020-10-03 ENCOUNTER — Other Ambulatory Visit (HOSPITAL_COMMUNITY): Payer: Self-pay | Admitting: Rheumatology

## 2020-10-03 DIAGNOSIS — M0600 Rheumatoid arthritis without rheumatoid factor, unspecified site: Secondary | ICD-10-CM | POA: Diagnosis not present

## 2020-10-03 DIAGNOSIS — Z79899 Other long term (current) drug therapy: Secondary | ICD-10-CM | POA: Diagnosis not present

## 2020-10-03 DIAGNOSIS — Z6824 Body mass index (BMI) 24.0-24.9, adult: Secondary | ICD-10-CM | POA: Diagnosis not present

## 2020-10-03 DIAGNOSIS — M2550 Pain in unspecified joint: Secondary | ICD-10-CM | POA: Diagnosis not present

## 2020-10-03 DIAGNOSIS — R5383 Other fatigue: Secondary | ICD-10-CM | POA: Diagnosis not present

## 2020-10-03 DIAGNOSIS — M545 Low back pain, unspecified: Secondary | ICD-10-CM | POA: Diagnosis not present

## 2020-10-03 DIAGNOSIS — Z111 Encounter for screening for respiratory tuberculosis: Secondary | ICD-10-CM | POA: Diagnosis not present

## 2020-10-09 ENCOUNTER — Other Ambulatory Visit: Payer: Self-pay | Admitting: Pharmacist

## 2020-10-09 ENCOUNTER — Other Ambulatory Visit (HOSPITAL_COMMUNITY): Payer: Self-pay

## 2020-10-09 MED ORDER — ADALIMUMAB 40 MG/0.4ML ~~LOC~~ AJKT
AUTO-INJECTOR | SUBCUTANEOUS | 6 refills | Status: DC
  Filled 2020-10-09: qty 4, fill #0
  Filled 2020-10-09: qty 4, 28d supply, fill #0
  Filled 2020-10-09: qty 4, 30d supply, fill #0
  Filled 2020-11-05: qty 4, 28d supply, fill #1
  Filled 2020-12-08: qty 4, 28d supply, fill #2
  Filled 2021-01-07: qty 4, 28d supply, fill #3
  Filled 2021-02-06: qty 4, 28d supply, fill #4
  Filled 2021-03-04: qty 4, 28d supply, fill #5
  Filled 2021-04-08: qty 4, 28d supply, fill #6

## 2020-10-09 MED FILL — Adalimumab Auto-injector Kit 40 MG/0.4ML: SUBCUTANEOUS | 28 days supply | Qty: 4 | Fill #0 | Status: CN

## 2020-10-13 ENCOUNTER — Other Ambulatory Visit (HOSPITAL_COMMUNITY): Payer: Self-pay

## 2020-10-16 ENCOUNTER — Other Ambulatory Visit (HOSPITAL_COMMUNITY): Payer: Self-pay

## 2020-11-05 ENCOUNTER — Other Ambulatory Visit (HOSPITAL_COMMUNITY): Payer: Self-pay

## 2020-11-05 MED ORDER — CELECOXIB 200 MG PO CAPS
ORAL_CAPSULE | ORAL | 1 refills | Status: DC
  Filled 2020-11-05: qty 180, 90d supply, fill #0

## 2020-11-11 ENCOUNTER — Other Ambulatory Visit (HOSPITAL_COMMUNITY): Payer: Self-pay

## 2020-11-20 DIAGNOSIS — Z760 Encounter for issue of repeat prescription: Secondary | ICD-10-CM | POA: Diagnosis not present

## 2020-11-24 DIAGNOSIS — Z1331 Encounter for screening for depression: Secondary | ICD-10-CM | POA: Diagnosis not present

## 2020-11-24 DIAGNOSIS — Z6823 Body mass index (BMI) 23.0-23.9, adult: Secondary | ICD-10-CM | POA: Diagnosis not present

## 2020-11-24 DIAGNOSIS — Z01419 Encounter for gynecological examination (general) (routine) without abnormal findings: Secondary | ICD-10-CM | POA: Diagnosis not present

## 2020-11-24 DIAGNOSIS — E049 Nontoxic goiter, unspecified: Secondary | ICD-10-CM | POA: Diagnosis not present

## 2020-11-24 DIAGNOSIS — M069 Rheumatoid arthritis, unspecified: Secondary | ICD-10-CM | POA: Diagnosis not present

## 2020-12-03 DIAGNOSIS — Z0000 Encounter for general adult medical examination without abnormal findings: Secondary | ICD-10-CM | POA: Diagnosis not present

## 2020-12-03 DIAGNOSIS — R232 Flushing: Secondary | ICD-10-CM | POA: Diagnosis not present

## 2020-12-03 DIAGNOSIS — I659 Occlusion and stenosis of unspecified precerebral artery: Secondary | ICD-10-CM | POA: Diagnosis not present

## 2020-12-08 ENCOUNTER — Other Ambulatory Visit (HOSPITAL_COMMUNITY): Payer: Self-pay

## 2020-12-16 ENCOUNTER — Other Ambulatory Visit (HOSPITAL_COMMUNITY): Payer: Self-pay

## 2021-01-07 ENCOUNTER — Other Ambulatory Visit (HOSPITAL_COMMUNITY): Payer: Self-pay

## 2021-01-08 ENCOUNTER — Other Ambulatory Visit (HOSPITAL_COMMUNITY): Payer: Self-pay

## 2021-01-08 ENCOUNTER — Encounter: Payer: Self-pay | Admitting: Orthopaedic Surgery

## 2021-01-08 ENCOUNTER — Ambulatory Visit: Payer: 59 | Admitting: Orthopaedic Surgery

## 2021-01-08 ENCOUNTER — Ambulatory Visit (INDEPENDENT_AMBULATORY_CARE_PROVIDER_SITE_OTHER): Payer: 59

## 2021-01-08 DIAGNOSIS — M25571 Pain in right ankle and joints of right foot: Secondary | ICD-10-CM | POA: Diagnosis not present

## 2021-01-08 DIAGNOSIS — M76829 Posterior tibial tendinitis, unspecified leg: Secondary | ICD-10-CM | POA: Diagnosis not present

## 2021-01-08 MED ORDER — PREDNISONE 50 MG PO TABS
50.0000 mg | ORAL_TABLET | Freq: Every day | ORAL | 0 refills | Status: DC
  Filled 2021-01-08: qty 5, 5d supply, fill #0

## 2021-01-08 MED FILL — Fluticasone Propionate Nasal Susp 50 MCG/ACT: NASAL | 90 days supply | Qty: 48 | Fill #0 | Status: AC

## 2021-01-08 NOTE — Progress Notes (Signed)
Office Visit Note   Patient: Alice Klein           Date of Birth: [birth year]           MRN: [id number] Visit Date: 01/08/2021              Requested by: Serita Grammes, MD [address] [place],  [place] [address] PCP: Serita Grammes, MD   Assessment & Plan: Visit Diagnoses:  1. Pain in right ankle and joints of right foot   2. Posterior tibial tendon dysfunction     Plan: Her clinical exam and signs and symptoms seem to be consistent with posterior tibial tendinitis/dysfunction.  When I have her try 5 days of steroid as well as topical Voltaren gel and put her in a short walking boot.  I also recommended a compressive ankle sleeve such as a copper fit sleeve.  She is going to try these things as well as some stretching and we can see her back in 3 weeks to see how she is doing overall.  All question concerns were answered and addressed.  Follow-Up Instructions: Return in about 3 weeks (around 01/29/2021).   Orders:  Orders Placed This Encounter  Procedures   XR Ankle Complete Right   Meds ordered this encounter  Medications   predniSONE (DELTASONE) 50 MG tablet    Sig: Take 1 tablet (50 mg total) by mouth daily.    Dispense:  5 tablet    Refill:  0      Procedures: No procedures performed   Clinical Data: No additional findings.   Subjective: Chief Complaint  Patient presents with   Right Ankle - Pain  The patient is well-known to me.  We actually replaced her left hip in January of this year.  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  She worked about 51 hours in the last week.  She has developed right ankle pain for about a month now.  She says is very tender in the morning when she first gets up.  She points to just behind the medial malleolus as a source of her pain on her right ankle.  She wonders if this has flared up as a result of recovering from her left hip replacement.  She reports some type of ankle injury in 2005 and  she did wear a boot then.  She has been taking Celebrex and trying to rest her ankle with ice and elevation.  It is becoming certainly more painful with activities.  She is worked on her shoewear as well.  She is now diabetic.  HPI  Review of Systems There is currently listed no headache, chest pain, shortness of breath, fever, chills, nausea, vomiting  Objective: Vital Signs: There were no vitals taken for this visit.  Physical Exam She is alert and oriented x3 and in no acute distress Ortho Exam Examination of her right ankle shows tenderness along the course of the posterior tibial tendon.  Her Achilles shows no pain to palpation and her [place] Silos test is negative.  She has normal-appearing arch of her foot and her foot is well-perfused.  She is able to perform a toe raise on the right side but it is painful to do so on the medial aspect of her ankle. Specialty Comments:  No specialty comments available.  Imaging: XR Ankle Complete Right  Result Date: 01/08/2021 3 views of the right ankle show no acute findings.  There is midfoot  arthritic changes at the navicular with the head of the talus.    PMFS History: Patient Active Problem List   Diagnosis Date Noted   Status post hip replacement 07/11/2020   Unilateral primary osteoarthritis, left hip 07/10/2020   Palpitations 08/23/2019   Past Medical History:  Diagnosis Date   Allergies    Anxiety    Chronic kidney disease    uritral re-implantation   GERD (gastroesophageal reflux disease)    Headache    Palpitations 08/23/2019   Pneumonia    Reactive airway disease    Seronegative rheumatoid arthritis ([facility])    SVT (supraventricular tachycardia) ([facility])    UTI (urinary tract infection)     Family History  Problem Relation Age of Onset   Colon polyps Mother    Hypertension Mother    Hypertension Father    Stroke Brother    Colon cancer Neg Hx     Past Surgical History:  Procedure Laterality Date   DILATION AND CURETTAGE  OF UTERUS  2009   ESOPHAGOGASTRODUODENOSCOPY  10/29/2013   Normal EGD   HIP ARTHROPLASTY Left    INTRAUTERINE DEVICE (IUD) INSERTION  02/2009   OTHER SURGICAL HISTORY     Ureteral Surgery    SHOULDER ARTHROSCOPY Right    TONSILLECTOMY     TOTAL HIP ARTHROPLASTY Left 07/11/2020   Procedure: LEFT TOTAL HIP ARTHROPLASTY ANTERIOR APPROACH;  Surgeon: Mcarthur Rossetti, MD;  Location: [facility];  Service: Orthopedics;  Laterality: Left;   Social History   Occupational History   Not on file  Tobacco Use   Smoking status: Former    Pack years: 0.00    Types: Cigarettes    Quit date: 2004    Years since quitting: 18.5   Smokeless tobacco: Never  Vaping Use   Vaping Use: Never used  Substance and Sexual Activity   Alcohol use: Yes    Alcohol/week: 3.0 standard drinks    Types: 3 Glasses of wine per week    Comment: weekly   Drug use: Never   Sexual activity: Yes

## 2021-01-13 ENCOUNTER — Other Ambulatory Visit (HOSPITAL_COMMUNITY): Payer: Self-pay

## 2021-01-29 ENCOUNTER — Ambulatory Visit: Payer: 59 | Admitting: Orthopaedic Surgery

## 2021-01-29 ENCOUNTER — Encounter: Payer: Self-pay | Admitting: Orthopaedic Surgery

## 2021-01-29 ENCOUNTER — Other Ambulatory Visit: Payer: Self-pay

## 2021-01-29 DIAGNOSIS — M25571 Pain in right ankle and joints of right foot: Secondary | ICD-10-CM

## 2021-01-29 DIAGNOSIS — M76829 Posterior tibial tendinitis, unspecified leg: Secondary | ICD-10-CM | POA: Diagnosis not present

## 2021-01-29 NOTE — Progress Notes (Signed)
The patient is continuing to follow-up with the right ankle pain.  I have been treating her with anti-inflammatories and activity modification as well as a short cam walking boot due to pain along the course of the posterior tibial tendon on the right ankle.  She is still having pain along the posterior tibial tendon of the ankle but she is having tightness of her Achilles tendon now and pain over the peroneal tendons.  The cam walking boot has not offered her as much support and does rub on her ankle as well.  On exam again there is pain along the course of the posterior tibial tendon on the right ankle.  There is also some pain over the Achilles tendon and some over the peroneal tendons now.  Most of the pain goes along the posterior tibial tendon.  She is still struggling to show raise on the right side.  She can easily perform a toe raise on the left side on the right side it is painful to do a toe raise but she can perform it.  At this point we will switch her to an ASO for her right ankle but we do need to obtain an MRI of the right ankle and foot to assess the posterior tibial tendon for any type of tendon tear given the weakness that she is having and the pain along the course of posterior tibial tendon.  This is also given the failure of conservative treatment as well.  We will see her back after this MRI is performed.  All questions and concerns were answered and addressed.

## 2021-01-30 ENCOUNTER — Other Ambulatory Visit: Payer: Self-pay

## 2021-01-30 DIAGNOSIS — M76829 Posterior tibial tendinitis, unspecified leg: Secondary | ICD-10-CM

## 2021-01-30 DIAGNOSIS — M25571 Pain in right ankle and joints of right foot: Secondary | ICD-10-CM

## 2021-02-06 ENCOUNTER — Other Ambulatory Visit (HOSPITAL_COMMUNITY): Payer: Self-pay

## 2021-02-10 ENCOUNTER — Other Ambulatory Visit (HOSPITAL_COMMUNITY): Payer: Self-pay

## 2021-02-17 ENCOUNTER — Other Ambulatory Visit (HOSPITAL_COMMUNITY): Payer: Self-pay

## 2021-02-17 MED ORDER — DOXYCYCLINE HYCLATE 100 MG PO CAPS
100.0000 mg | ORAL_CAPSULE | Freq: Two times a day (BID) | ORAL | 0 refills | Status: DC
  Filled 2021-02-17: qty 14, 7d supply, fill #0

## 2021-02-18 ENCOUNTER — Ambulatory Visit
Admission: RE | Admit: 2021-02-18 | Discharge: 2021-02-18 | Disposition: A | Discharge status: Home / Self Care | Payer: 59 | Source: Ambulatory Visit | Attending: Orthopaedic Surgery | Admitting: Orthopaedic Surgery

## 2021-02-18 ENCOUNTER — Other Ambulatory Visit: Payer: Self-pay

## 2021-02-18 DIAGNOSIS — M25471 Effusion, right ankle: Secondary | ICD-10-CM | POA: Diagnosis not present

## 2021-02-18 DIAGNOSIS — M25571 Pain in right ankle and joints of right foot: Secondary | ICD-10-CM

## 2021-02-18 DIAGNOSIS — M76829 Posterior tibial tendinitis, unspecified leg: Secondary | ICD-10-CM

## 2021-02-18 DIAGNOSIS — M7989 Other specified soft tissue disorders: Secondary | ICD-10-CM | POA: Diagnosis not present

## 2021-02-18 DIAGNOSIS — M19071 Primary osteoarthritis, right ankle and foot: Secondary | ICD-10-CM | POA: Diagnosis not present

## 2021-02-18 IMAGING — MR MR FOOT*R* W/O CM
4 of 5 series · 19 of 40 positions shown · non-contrast
Comparison: None.

CLINICAL DATA: Medial foot and ankle pain for the past month.

EXAM:
MRI OF THE RIGHT FOREFOOT WITHOUT CONTRAST
TECHNIQUE: Multiplanar, multisequence MR imaging of the right forefoot was
performed. No intravenous contrast was administered.

[Series 4: T1 · coronal · 3.0mm · 0.19mm/px · 3 of 44 slices shown (1 of 2)]
[im 5/44]
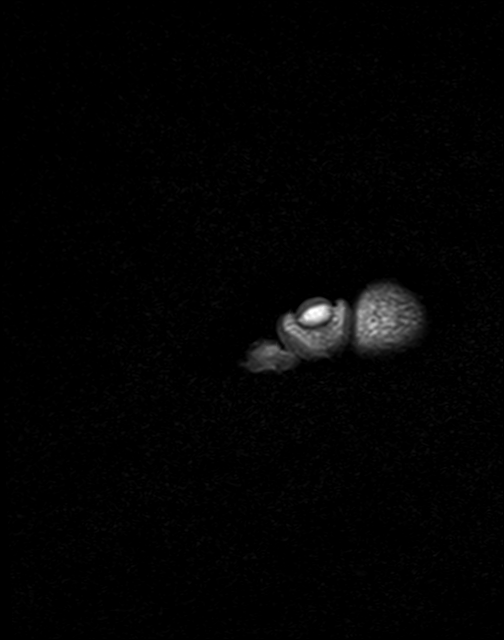
[im 24/44]
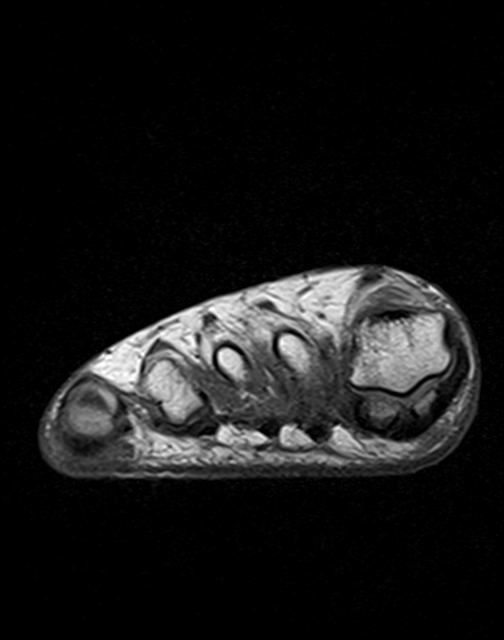
[im 39/44]
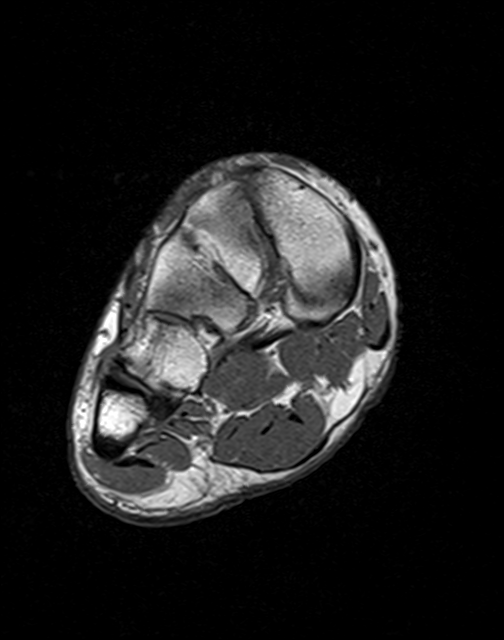

[Series 5: T2 fat-sat · coronal · 3.0mm · 0.19mm/px · 10 of 44 slices shown (1 of 2)]
[im 1/44]
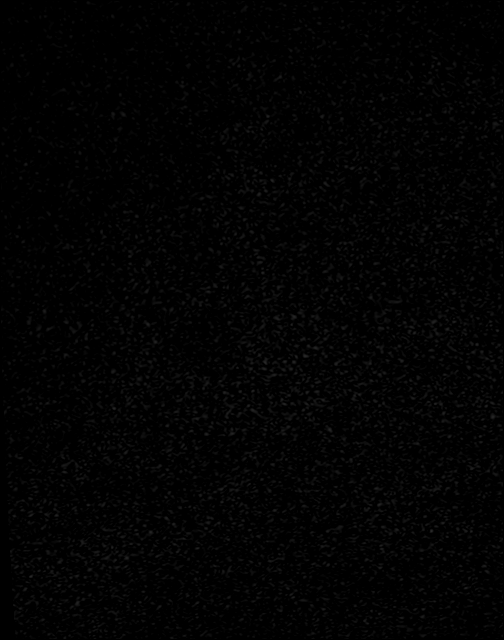
[im 5/44]
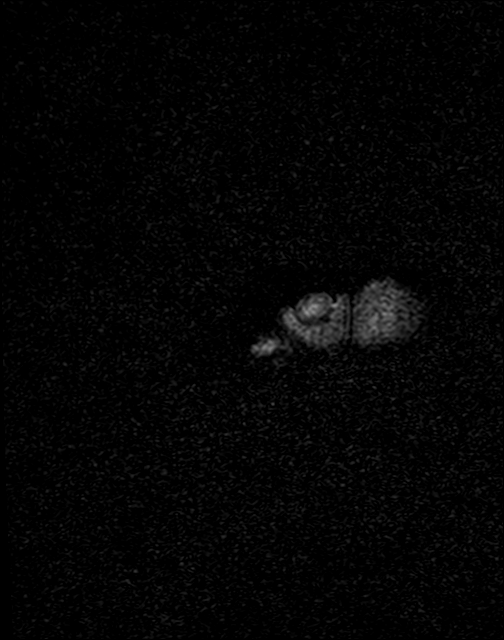
[im 9/44]
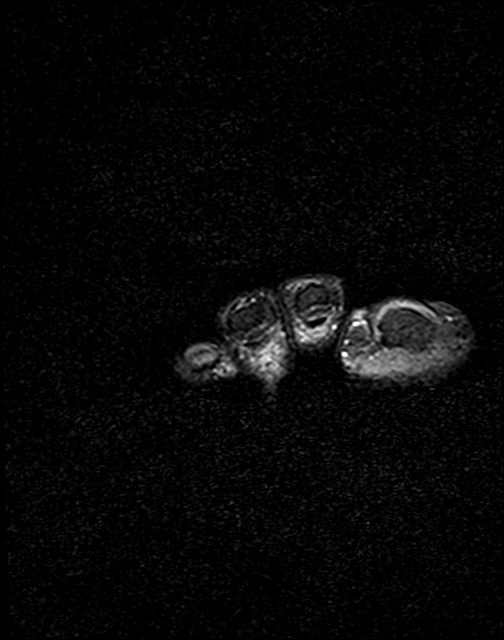
[im 13/44]
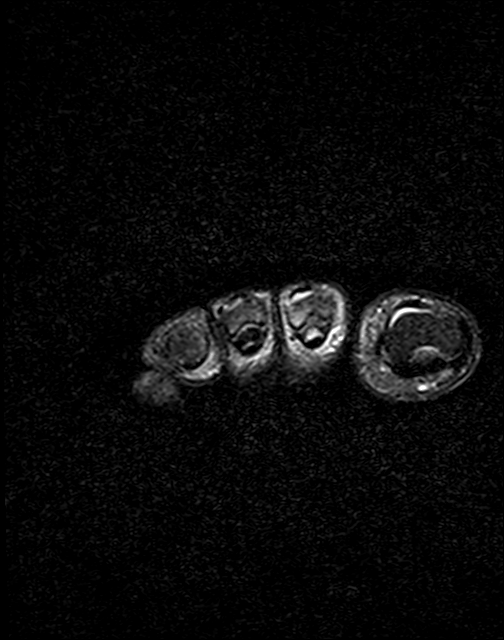
[im 18/44]
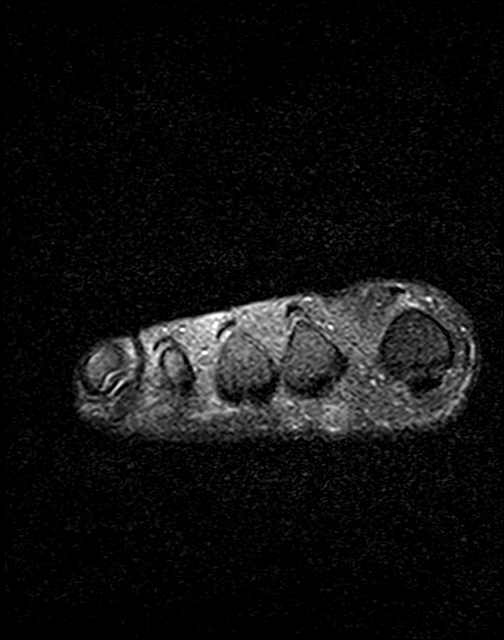
[im 22/44]
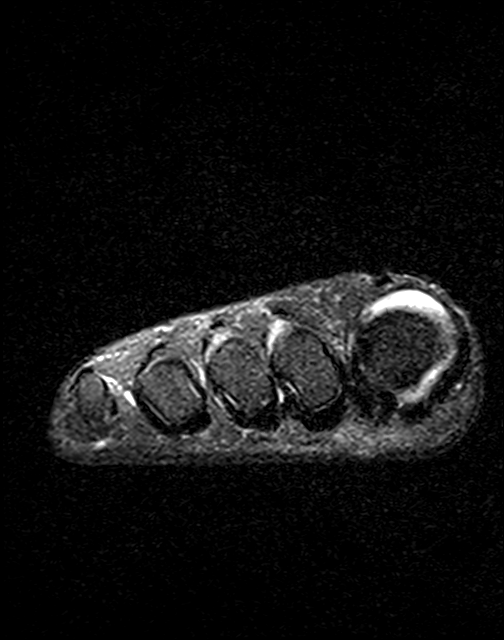
[im 26/44]
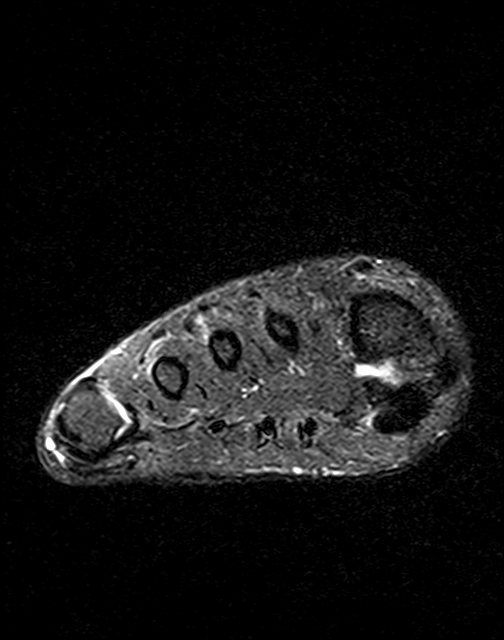
[im 31/44]
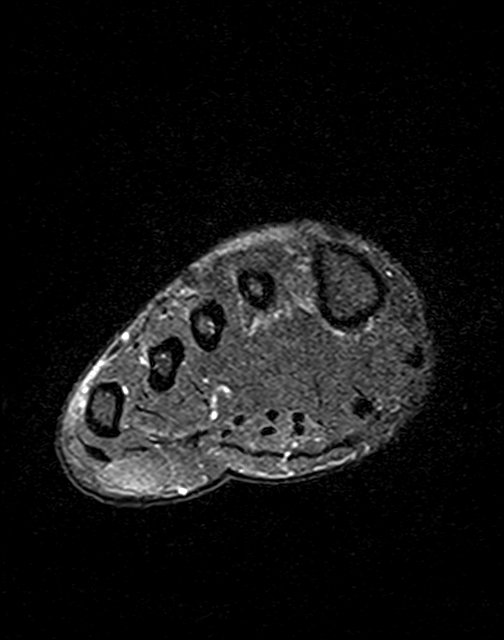
[im 35/44]
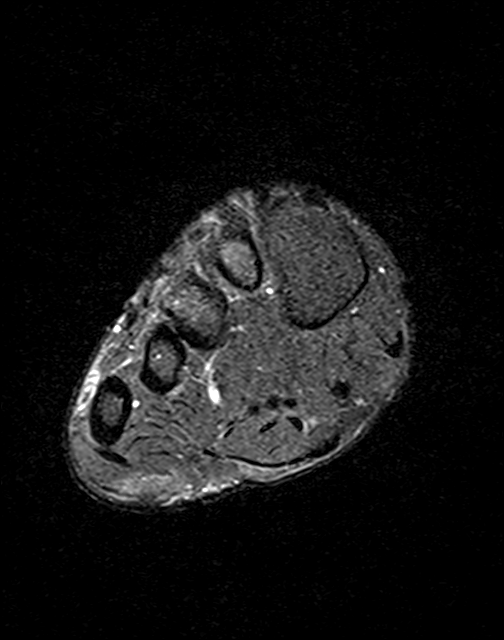
[im 39/44]
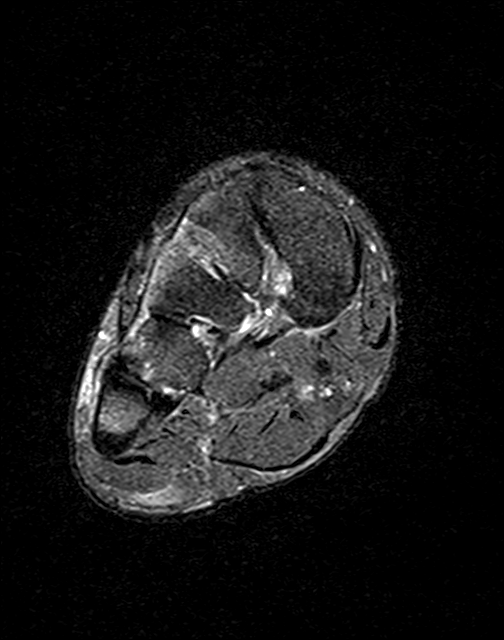

[Series 6: T2 fat-sat · axial · 3.0mm · 0.35mm/px · z∈[-110,-46]mm · 3 of 22 slices shown (2 of 2)]
[im 5/22]
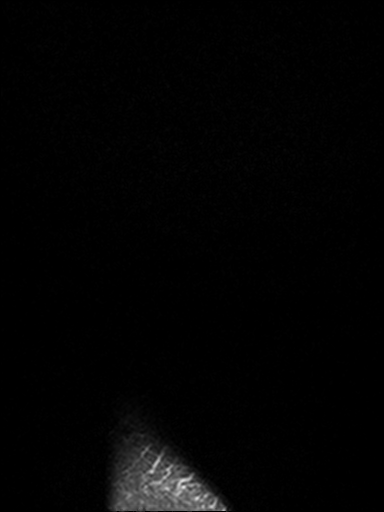
[im 13/22]
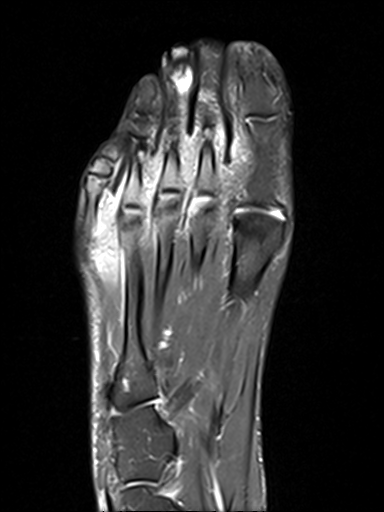
[im 22/22]
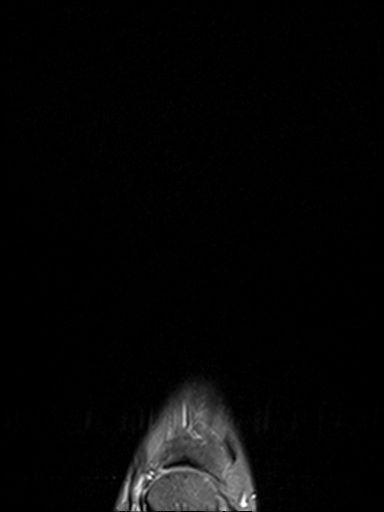

[Series 7: T1 · axial · 3.0mm · 0.35mm/px · z∈[-110,-46]mm · 3 of 22 slices shown (2 of 2)]
[im 5/22]
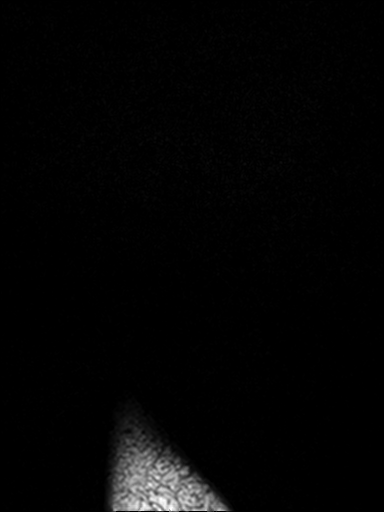
[im 13/22]
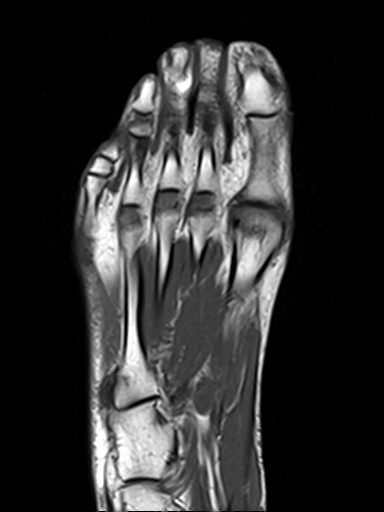
[im 22/22]
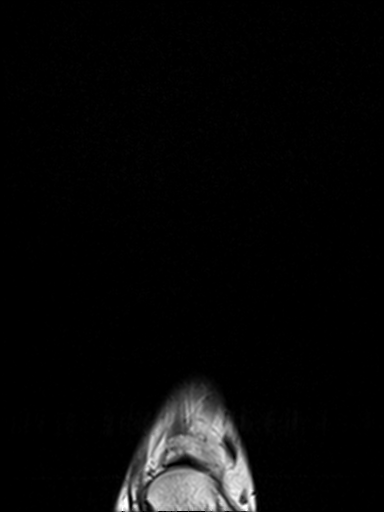

[19 of 40 positions shown; findings below may reference images not displayed]

FINDINGS: Bones/Joint/Cartilage

No marrow signal abnormality. No fracture or dislocation. Joint
spaces are preserved. No joint effusion.

Ligaments

Collateral ligaments are intact.  Lisfranc ligament is intact.

Muscles and Tendons
Flexor and extensor tendons are intact.

Soft tissue
No fluid collection or hematoma.  No soft tissue mass.
IMPRESSION: 1. Normal MRI of the right forefoot.

## 2021-02-18 IMAGING — MR MR ANKLE*R* W/O CM
5 series · 38 of 40 positions shown · non-contrast
Comparison: Right ankle x-rays dated [DATE].

CLINICAL DATA: Medial ankle pain for the past month. History of
remote injury.

EXAM:
MRI OF THE RIGHT ANKLE WITHOUT CONTRAST
TECHNIQUE: Multiplanar, multisequence MR imaging of the ankle was performed. No
intravenous contrast was administered.

[Series 1: T2 fat-sat · axial · 3.0mm · 0.50mm/px · z∈[-71,+50]mm · 9 of 32 slices shown (1 of 2)]
[im 1/32]
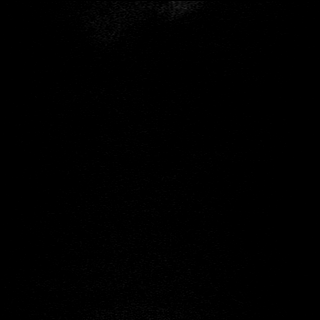
[im 4/32]
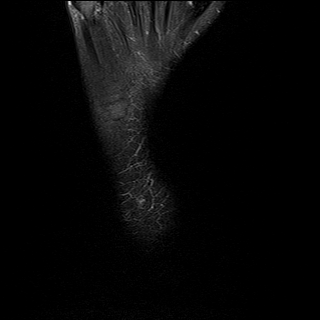
[im 8/32]
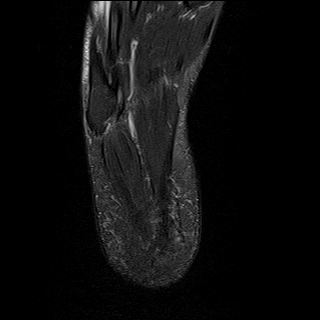
[im 12/32]
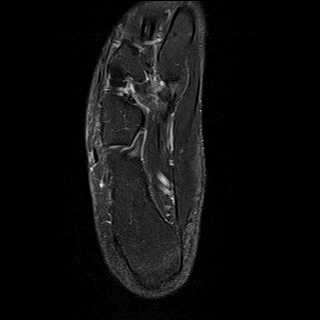
[im 16/32]
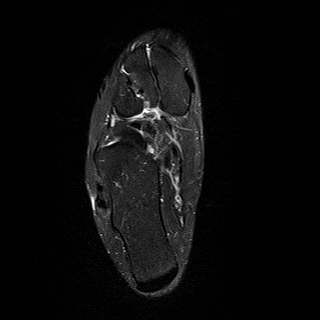
[im 20/32]
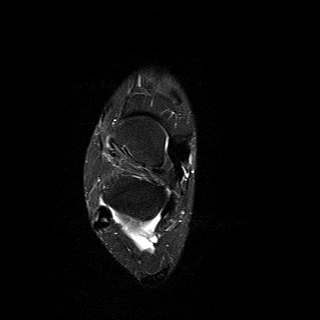
[im 24/32]
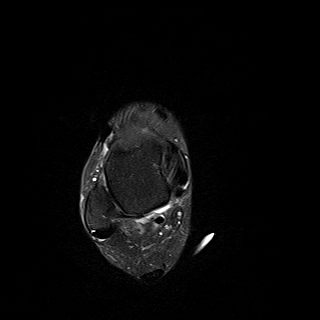
[im 28/32]
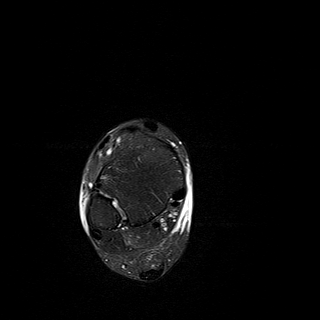
[im 32/32]
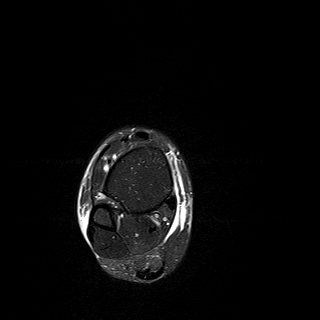

[Series 2: PD fat-sat · axial · 3.0mm · 0.50mm/px · z∈[-71,+50]mm · 9 of 32 slices shown]
[im 1/32]
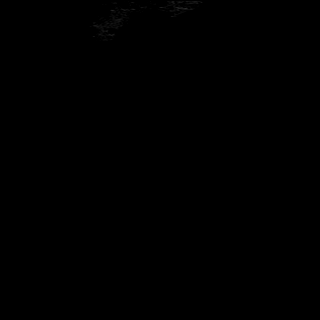
[im 4/32]
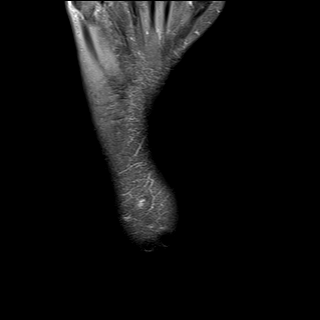
[im 8/32]
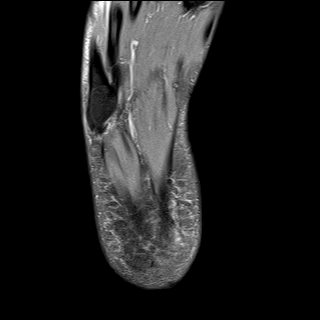
[im 12/32]
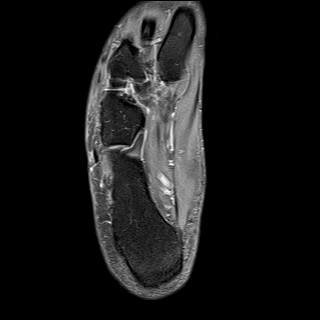
[im 16/32]
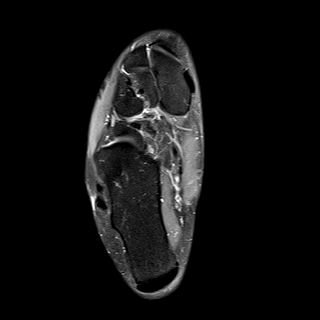
[im 20/32]
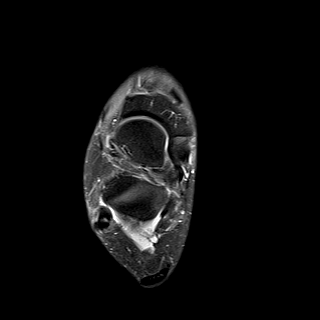
[im 24/32]
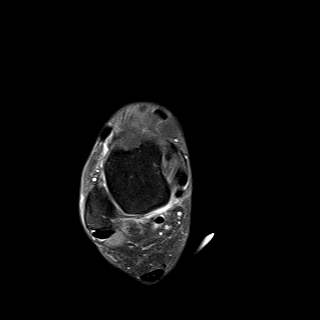
[im 28/32]
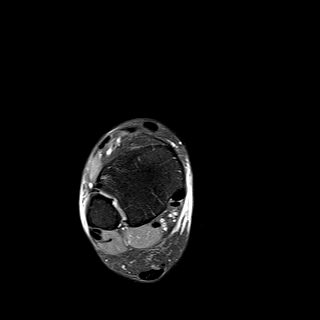
[im 32/32]
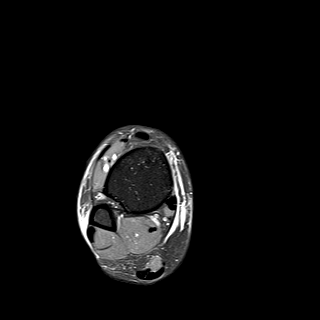

[Series 3: T1 · sagittal · 4.0mm · 0.56mm/px · 6 of 22 slices shown]
[im 1/22]
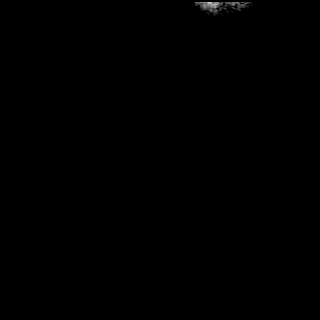
[im 5/22]
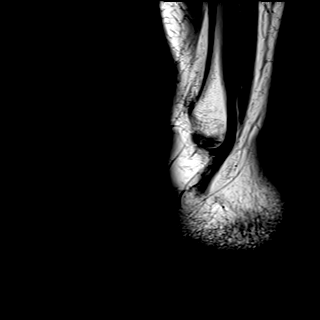
[im 9/22]
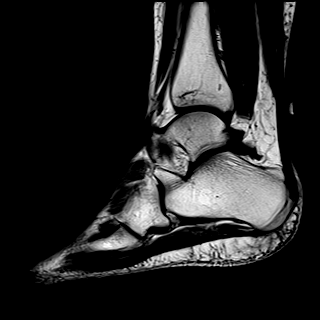
[im 13/22]
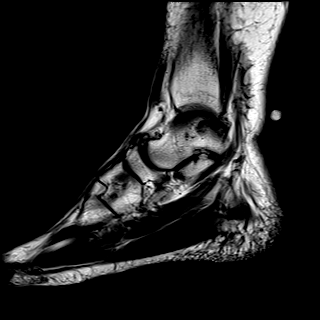
[im 17/22]
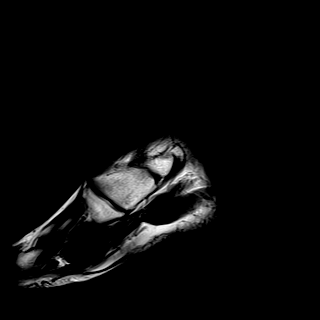
[im 22/22]
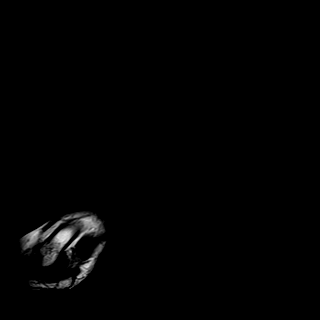

[Series 4: STIR · sagittal · 4.0mm · 0.35mm/px · 6 of 22 slices shown]
[im 1/22]
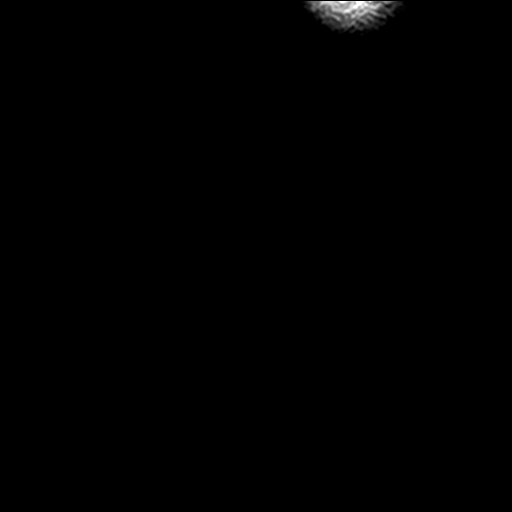
[im 5/22]
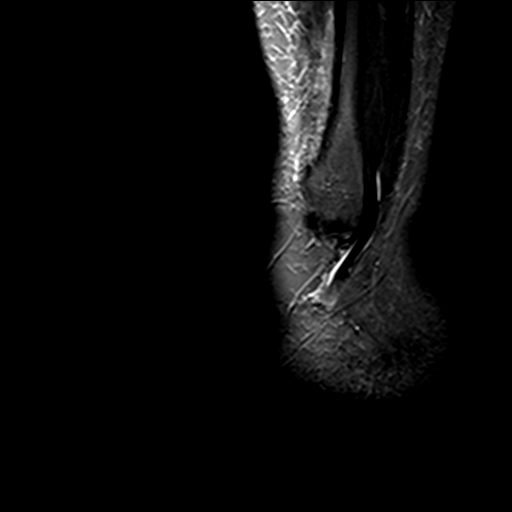
[im 9/22]
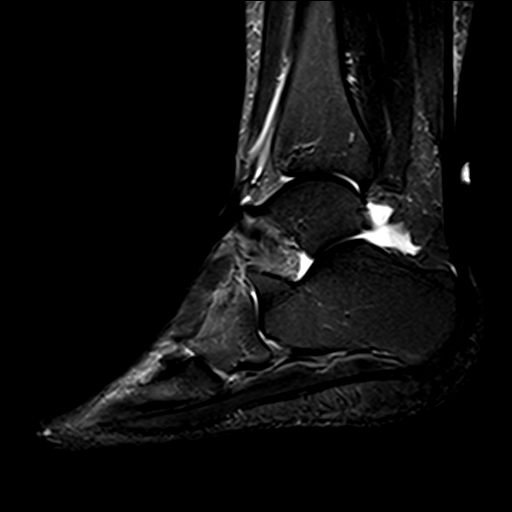
[im 13/22]
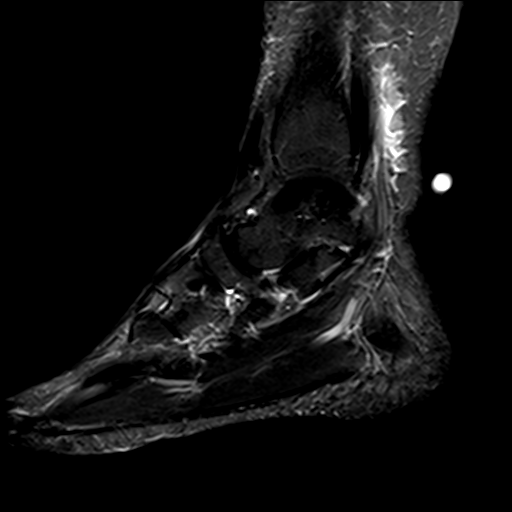
[im 17/22]
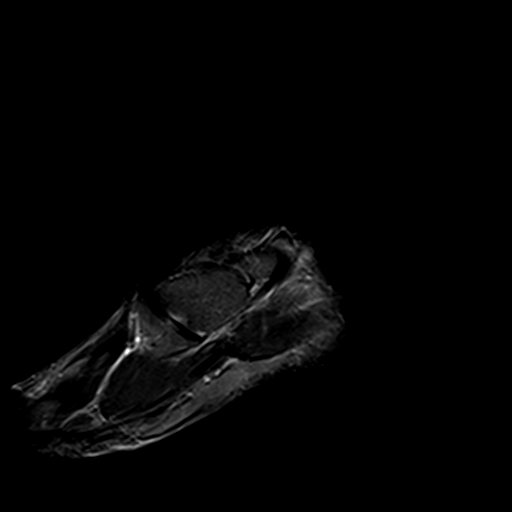
[im 22/22]
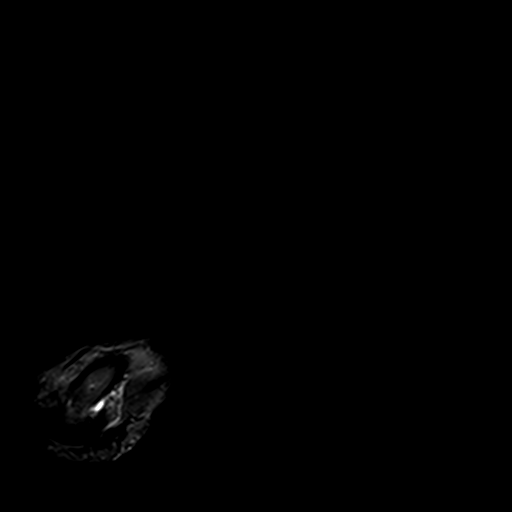

[Series 5: T2 fat-sat · coronal · 3.0mm · 0.50mm/px · 8 of 35 slices shown (2 of 2)]
[im 1/35]
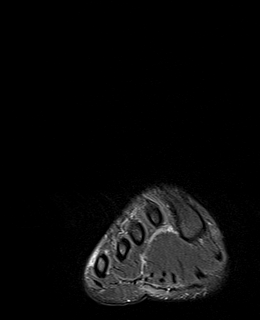
[im 4/35]
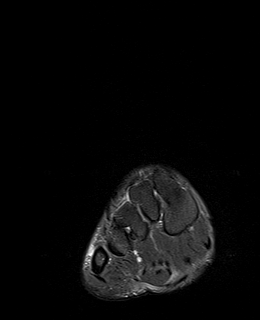
[im 12/35]
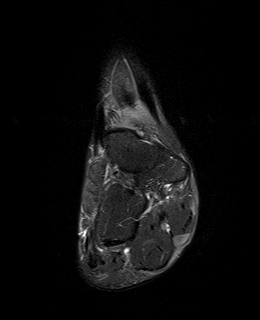
[im 16/35]
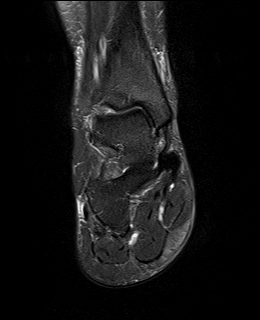
[im 19/35]
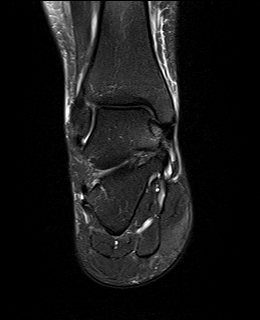
[im 23/35]
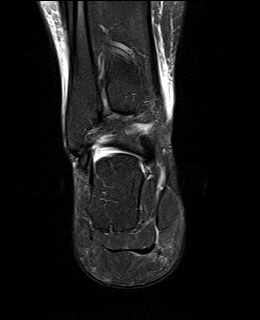
[im 31/35]
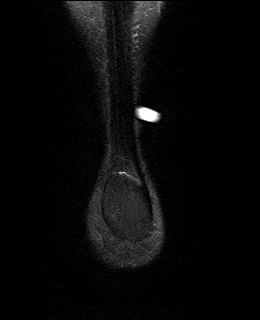
[im 35/35]
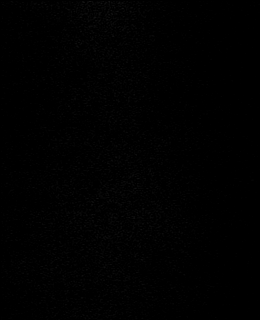

[38 of 40 positions shown; findings below may reference images not displayed]

FINDINGS: TENDONS

Peroneal: Peroneal longus tendon intact. Peroneal brevis intact.

Posteromedial: Posterior tibial tendon intact. Flexor digitorum
longus tendon intact. Flexor hallucis longus tendon intact.

Anterior: Tibialis anterior tendon intact. Extensor hallucis longus
tendon intact Extensor digitorum longus tendon intact.

Achilles:  Intact.

Plantar Fascia: Intact.

LIGAMENTS

Lateral: Anterior talofibular ligament intact. Calcaneofibular
ligament intact. Posterior talofibular ligament intact. Anterior and
posterior tibiofibular ligaments intact.

Medial: Deltoid ligament intact. Spring ligament intact.

CARTILAGE

Ankle Joint: No joint effusion. Normal ankle mortise. No chondral
defect.

Subtalar Joints/Sinus Tarsi: Normal subtalar joints. Small posterior
subtalar joint effusion. Normal sinus tarsi.

Bones: No marrow signal abnormality. No fracture or dislocation.
Mild degenerative changes of the talonavicular joint.

Soft Tissue: Mild bimalleolar soft tissue swelling. No soft tissue
mass or fluid collection.
IMPRESSION: 1. Unremarkable appearance of the posterior tibial and peroneal
tendons.
2. No acute abnormality or significant degenerative changes.
3. Small posterior subtalar joint effusion.

## 2021-02-20 ENCOUNTER — Other Ambulatory Visit (HOSPITAL_COMMUNITY): Payer: Self-pay | Admitting: Family Medicine

## 2021-02-20 DIAGNOSIS — E049 Nontoxic goiter, unspecified: Secondary | ICD-10-CM

## 2021-02-23 DIAGNOSIS — D2361 Other benign neoplasm of skin of right upper limb, including shoulder: Secondary | ICD-10-CM | POA: Diagnosis not present

## 2021-02-23 DIAGNOSIS — D2262 Melanocytic nevi of left upper limb, including shoulder: Secondary | ICD-10-CM | POA: Diagnosis not present

## 2021-02-23 DIAGNOSIS — L438 Other lichen planus: Secondary | ICD-10-CM | POA: Diagnosis not present

## 2021-02-23 DIAGNOSIS — D225 Melanocytic nevi of trunk: Secondary | ICD-10-CM | POA: Diagnosis not present

## 2021-02-23 DIAGNOSIS — D485 Neoplasm of uncertain behavior of skin: Secondary | ICD-10-CM | POA: Diagnosis not present

## 2021-02-23 DIAGNOSIS — D2261 Melanocytic nevi of right upper limb, including shoulder: Secondary | ICD-10-CM | POA: Diagnosis not present

## 2021-02-23 DIAGNOSIS — L718 Other rosacea: Secondary | ICD-10-CM | POA: Diagnosis not present

## 2021-02-23 DIAGNOSIS — L570 Actinic keratosis: Secondary | ICD-10-CM | POA: Diagnosis not present

## 2021-03-02 ENCOUNTER — Ambulatory Visit (HOSPITAL_COMMUNITY)
Admission: RE | Admit: 2021-03-02 | Discharge: 2021-03-02 | Disposition: A | Discharge status: Home / Self Care | Payer: 59 | Source: Ambulatory Visit | Attending: Family Medicine | Admitting: Family Medicine

## 2021-03-02 ENCOUNTER — Other Ambulatory Visit: Payer: Self-pay

## 2021-03-02 DIAGNOSIS — E049 Nontoxic goiter, unspecified: Secondary | ICD-10-CM | POA: Diagnosis not present

## 2021-03-02 DIAGNOSIS — E041 Nontoxic single thyroid nodule: Secondary | ICD-10-CM | POA: Diagnosis not present

## 2021-03-02 IMAGING — US US THYROID
1 series · 14 of 25 positions shown · non-contrast
Comparison: [DATE]

CLINICAL DATA: Goiter.

EXAM:
THYROID ULTRASOUND
TECHNIQUE: Ultrasound examination of the thyroid gland and adjacent soft
tissues was performed.

[Series 1: us thyroid · 14 of 37 slices shown]
[im 1/37]
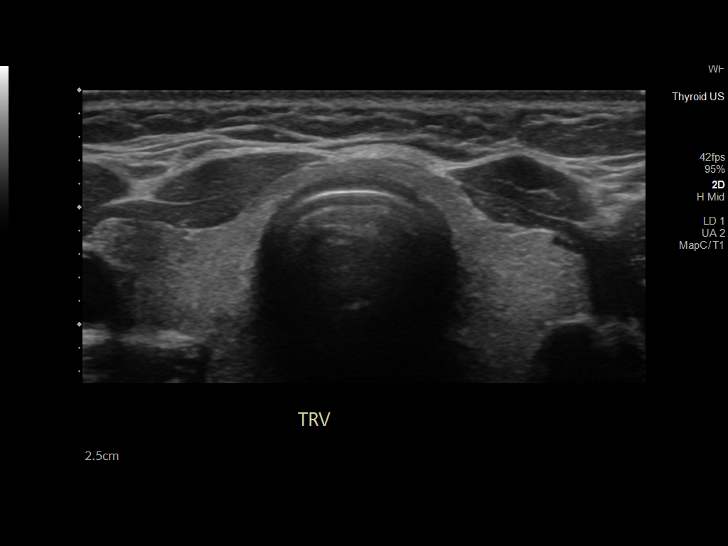
[im 4/37]
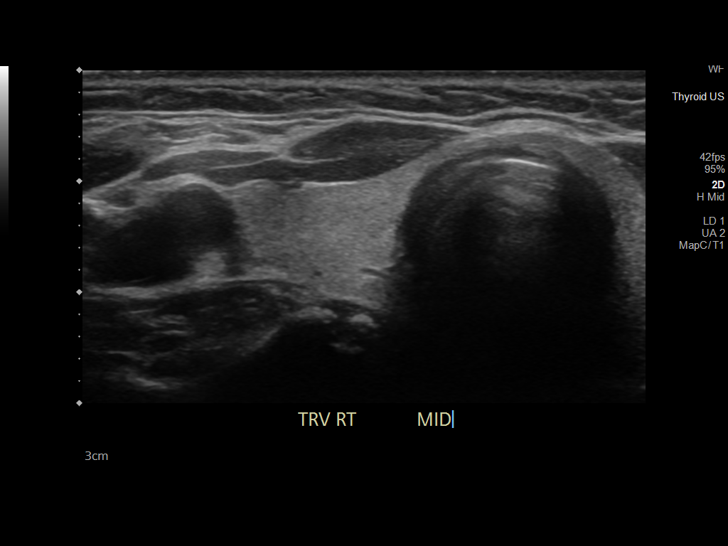
[im 7/37]
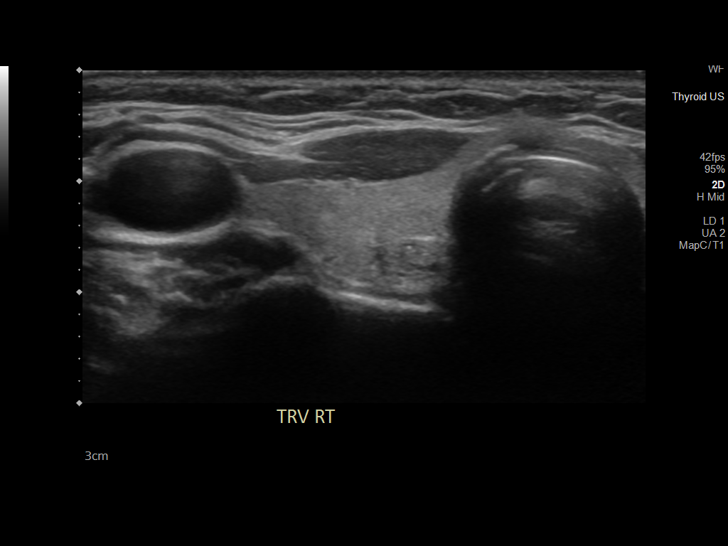
[im 10/37]
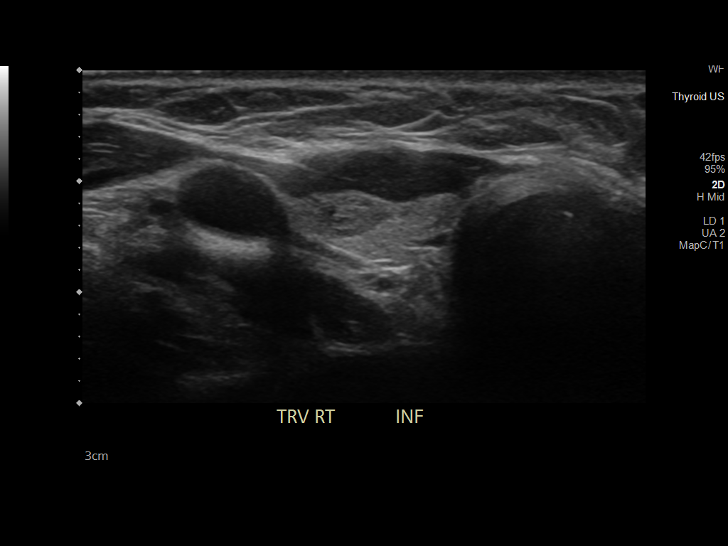
[im 13/37]
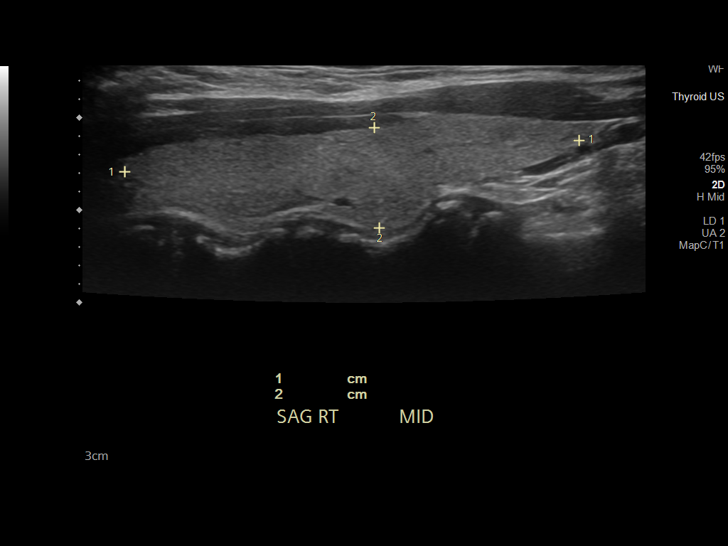
[im 14/37]
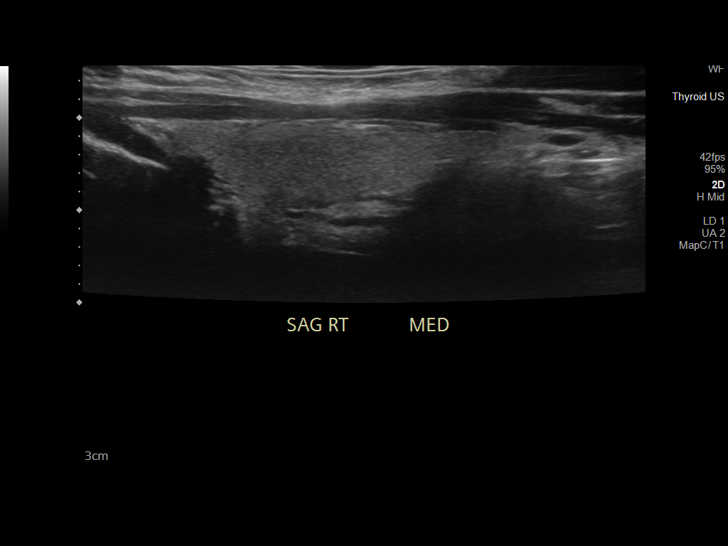
[im 17/37]
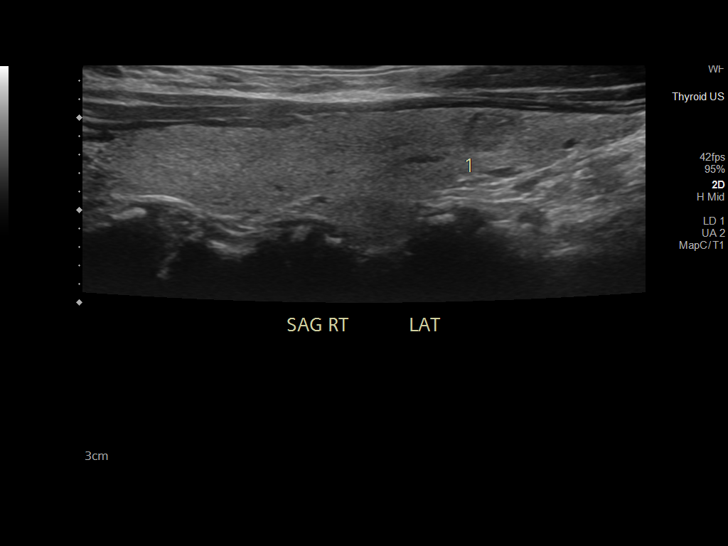
[im 20/37]
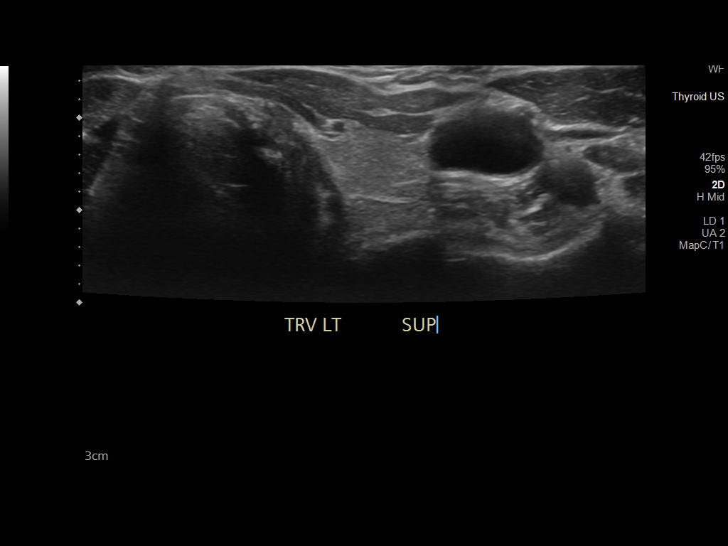
[im 23/37]
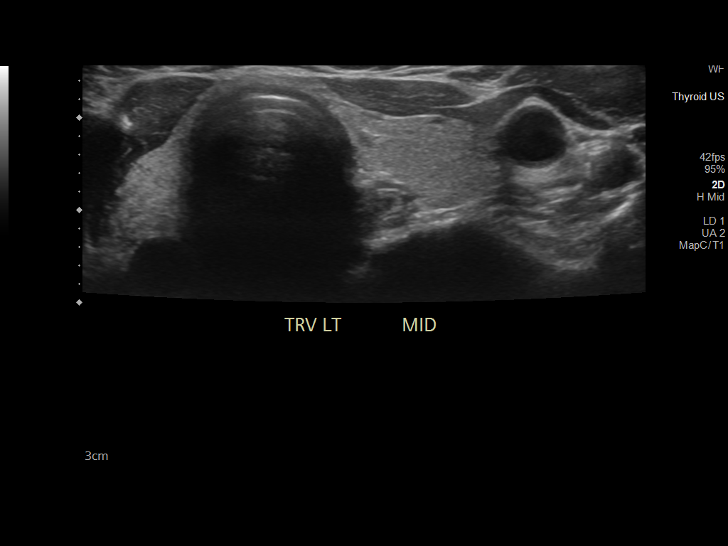
[im 25/37]
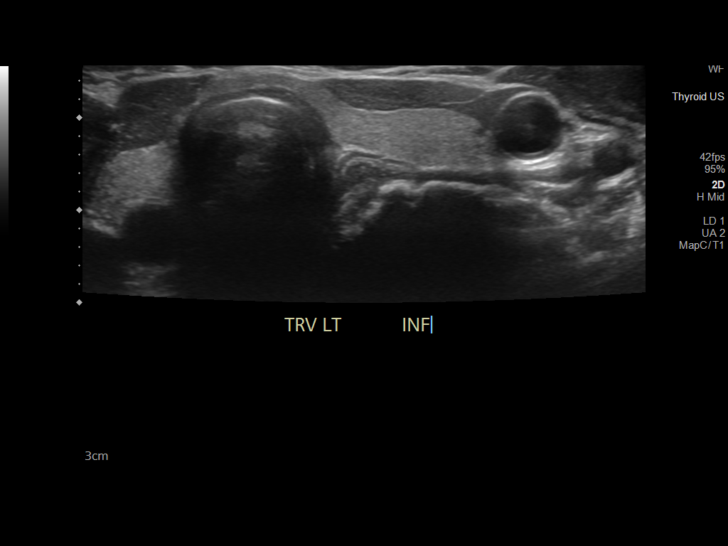
[im 28/37]
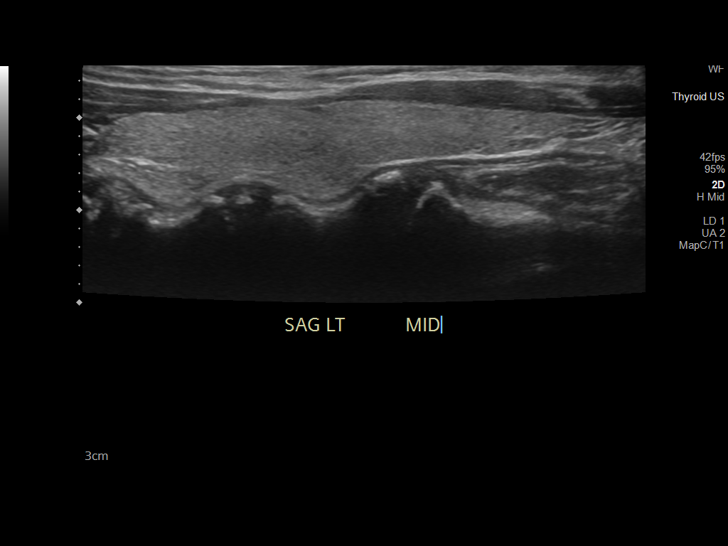
[im 31/37]
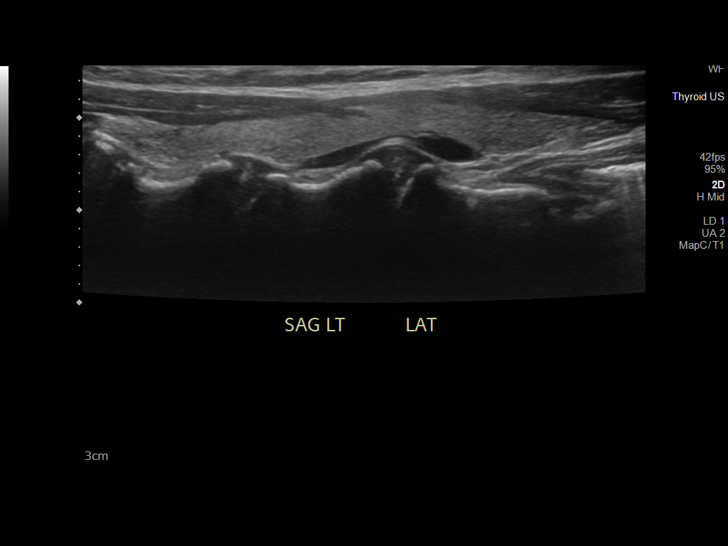
[im 34/37]
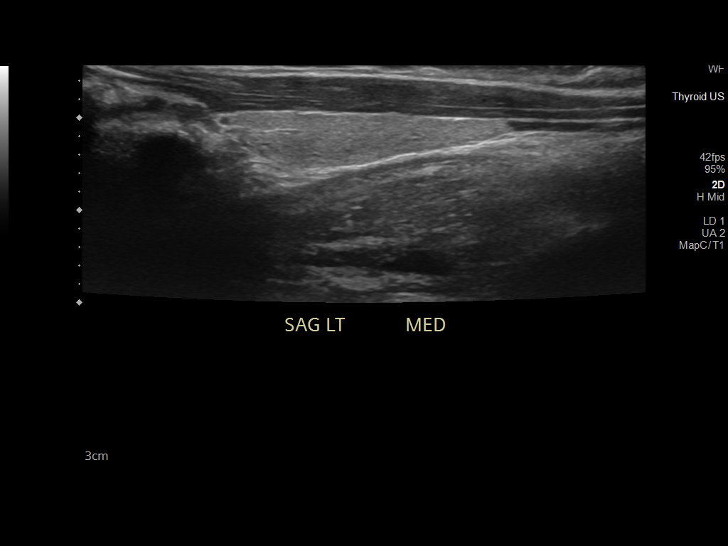
[im 37/37]
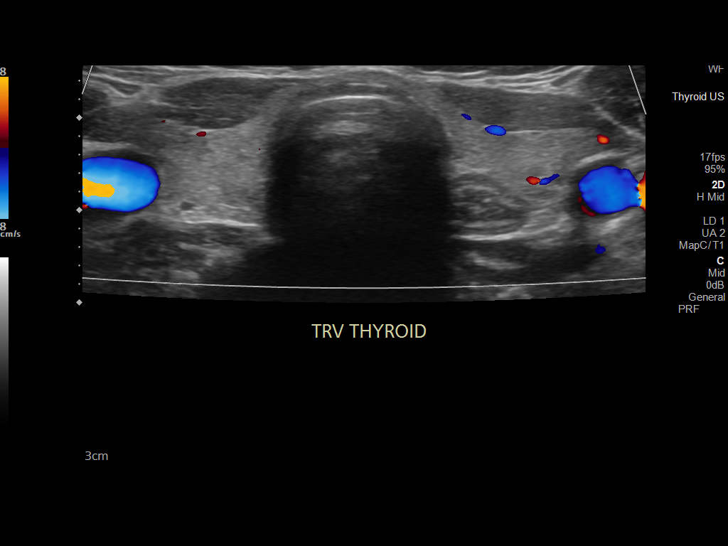

[14 of 25 positions shown; findings below may reference images not displayed]

FINDINGS: Parenchymal Echotexture: Normal

Isthmus: 0.2 cm

Right lobe: 4.9 x 1.1 x 1.4 cm

Left lobe: 5.0 x 1.0 x 1.5 cm

_________________________________________________________

Estimated total number of nodules >/= 1 cm: 0

Number of spongiform nodules >/=  2 cm not described below (TR1): 0

Number of mixed cystic and solid nodules >/= 1.5 cm not described
below (TR2): 0

_________________________________________________________

0.7 x 0.5 x 0.6 cm hypoechoic solid nodule in the inferior right
thyroid lobe does not meet criteria for FNA or imaging surveillance.
IMPRESSION: Solitary, subcentimeter right inferior thyroid nodule does not meet
criteria for FNA or imaging surveillance.

The above is in keeping with the ACR TI-RADS recommendations - [HOSPITAL] [2O];[DATE].

## 2021-03-04 ENCOUNTER — Other Ambulatory Visit: Payer: Self-pay

## 2021-03-04 ENCOUNTER — Encounter: Payer: Self-pay | Admitting: Orthopaedic Surgery

## 2021-03-04 ENCOUNTER — Ambulatory Visit: Payer: 59 | Admitting: Orthopaedic Surgery

## 2021-03-04 ENCOUNTER — Other Ambulatory Visit (HOSPITAL_COMMUNITY): Payer: Self-pay

## 2021-03-04 DIAGNOSIS — M25571 Pain in right ankle and joints of right foot: Secondary | ICD-10-CM | POA: Diagnosis not present

## 2021-03-04 DIAGNOSIS — M76829 Posterior tibial tendinitis, unspecified leg: Secondary | ICD-10-CM | POA: Diagnosis not present

## 2021-03-04 NOTE — Progress Notes (Signed)
The patient comes in today to go over MRI of her right ankle.  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  She has been dealing with significant left ankle pain especially along the medial aspect of her ankle and at her last visit I was concerned about dysfunction of her posterior tibial tendon because she could not even barely perform a toe raise on that right side.  She comes in today to go over the MRI of that right ankle.  She is still having pain and stiffness with her ankle and is especially worse when she first gets up.  She has tried a lace up ankle brace as well.  There is no instability on her ankle exam but there is still some tenderness especially in the medial aspect of the ankle.  The MRI of her ankle shows some slight fluid in the subtalar joint but no gross findings as a relates to the tendons or ligaments around the ankle itself.  The posterior tibial tendon and the surrounding tendons on the medial aspect of her ankle are normal and the peroneal tendons are normal as well.  She does have some slight soft tissue swelling over the medial lateral malleolus.  Of note she does have a history of rheumatoid disease.  At this point I would like to send her to outpatient physical therapy for any modalities that they can provide or perform they can strengthen her right ankle and decrease some of the symptoms she is having.  She agrees with this treatment plan.  At her next visit we are actually going to obtain a standing low AP pelvis and a lateral of her operative hip.  We can also see at that visit how her ankle is doing.

## 2021-03-11 ENCOUNTER — Other Ambulatory Visit (HOSPITAL_COMMUNITY): Payer: Self-pay

## 2021-03-13 DIAGNOSIS — F4322 Adjustment disorder with anxiety: Secondary | ICD-10-CM | POA: Diagnosis not present

## 2021-03-13 DIAGNOSIS — F410 Panic disorder [episodic paroxysmal anxiety] without agoraphobia: Secondary | ICD-10-CM | POA: Diagnosis not present

## 2021-03-23 ENCOUNTER — Ambulatory Visit: Payer: 59 | Admitting: Physical Therapy

## 2021-03-23 ENCOUNTER — Ambulatory Visit: Payer: 59 | Admitting: Physician Assistant

## 2021-04-02 ENCOUNTER — Ambulatory Visit: Payer: 59 | Admitting: Physician Assistant

## 2021-04-02 ENCOUNTER — Encounter: Payer: Self-pay | Admitting: Physician Assistant

## 2021-04-02 ENCOUNTER — Other Ambulatory Visit (HOSPITAL_COMMUNITY): Payer: Self-pay

## 2021-04-02 ENCOUNTER — Ambulatory Visit: Payer: Self-pay

## 2021-04-02 DIAGNOSIS — Z96642 Presence of left artificial hip joint: Secondary | ICD-10-CM

## 2021-04-02 DIAGNOSIS — M722 Plantar fascial fibromatosis: Secondary | ICD-10-CM | POA: Diagnosis not present

## 2021-04-02 NOTE — Progress Notes (Signed)
HPI: Alice Klein comes in today for follow-up of her left total hip arthroplasty which she had on 07/11/2020.  Left hip is doing well.  She has no complaints.  Some tenderness about the incision site noted.  She states the incision is healed well with a slight keloid.  Main complaint today is right heel pain.  She states the ankle pain she was having medially is dissipated.  Now she feels she has heel pain that is consistent with plantar fasciitis.  She states the first step of the morning is worse.  She did change her shoewear recently and feels that this made it worse.  She does wear some crocs that home without a strap.  She has had no injury to the heel. Review of systems see HPI otherwise negative or noncontributory.  Physical exam: General well-developed well-nourished female no acute distress mood affect appropriate.  Psych alert and oriented x3 Left hip: Excellent range of motion without pain.  Ambulates without any assistive device. Bilateral feet: Dorsal pedal pulses are 2+.  No rashes skin lesions ulcerations.  Left foot nontender throughout.  Right foot she has tenderness in the medial tubercle of the calcaneus otherwise no tenderness over the posterior tibial tendon peroneal tendons bilaterally.  No tenderness over the Achilles bilaterally Achilles are intact bilaterally.  Slight tenderness over the right sinus Tarsi region.  Tight gastroc on the right.  Radiographs: AP pelvis and lateral view of the left hip: Both hips well located.  Status post left total hip arthroplasty with well-seated components.  No acute fractures or bony abnormalities.  Impression: Status post left total hip arthroplasty on 17/22 Right foot plantar fasciitis  Plan: Discussed with her proper shoe wear.  Gastrocsoleus stretching.  Recommend the use of Voltaren gel 4 g 4 times daily to the medial tubercle of the calcaneus right heel.  Discussed with her sending her to possible formal therapy if she fails conservative treatment.   In regards to her left hip recommend continued scar tissue mobilization.  Follow-up with [place] if she has any questions or concerns.

## 2021-04-08 ENCOUNTER — Other Ambulatory Visit (HOSPITAL_COMMUNITY): Payer: Self-pay

## 2021-04-08 DIAGNOSIS — F4322 Adjustment disorder with anxiety: Secondary | ICD-10-CM | POA: Diagnosis not present

## 2021-04-08 DIAGNOSIS — M069 Rheumatoid arthritis, unspecified: Secondary | ICD-10-CM | POA: Diagnosis not present

## 2021-04-08 DIAGNOSIS — R0609 Other forms of dyspnea: Secondary | ICD-10-CM | POA: Diagnosis not present

## 2021-04-08 DIAGNOSIS — Z6824 Body mass index (BMI) 24.0-24.9, adult: Secondary | ICD-10-CM | POA: Diagnosis not present

## 2021-04-08 DIAGNOSIS — R600 Localized edema: Secondary | ICD-10-CM | POA: Diagnosis not present

## 2021-04-13 DIAGNOSIS — L988 Other specified disorders of the skin and subcutaneous tissue: Secondary | ICD-10-CM | POA: Diagnosis not present

## 2021-04-13 DIAGNOSIS — D485 Neoplasm of uncertain behavior of skin: Secondary | ICD-10-CM | POA: Diagnosis not present

## 2021-04-17 DIAGNOSIS — R0609 Other forms of dyspnea: Secondary | ICD-10-CM | POA: Diagnosis not present

## 2021-04-27 ENCOUNTER — Telehealth: Payer: Self-pay | Admitting: Pharmacist

## 2021-04-27 NOTE — Telephone Encounter (Signed)
Called patient to schedule an appointment for the [facility]. I was unable to reach the patient so I left a HIPAA-compliant message requesting that the patient return my call.   Luke Van Ausdall, PharmD, BCACP, CPP Clinical Pharmacist [facility] [phone]  

## 2021-04-28 ENCOUNTER — Ambulatory Visit: Payer: 59 | Attending: Family Medicine | Admitting: Pharmacist

## 2021-04-28 ENCOUNTER — Other Ambulatory Visit: Payer: Self-pay

## 2021-04-28 ENCOUNTER — Other Ambulatory Visit (HOSPITAL_COMMUNITY): Payer: Self-pay

## 2021-04-28 DIAGNOSIS — Z7189 Other specified counseling: Secondary | ICD-10-CM

## 2021-04-28 NOTE — Progress Notes (Signed)
  S: Patient presents for review of their specialty medication therapy.  Patient is currently taking Humira for RA. Patient is managed by Dr. Gavin Pound for this.   Adherence: reported  Efficacy: reports that it is going okay with Humira   Dosing:  Rheumatoid arthritis: SubQ: 40 mg every week   Dose adjustments: Renal: no dose adjustments (has not been studied) Hepatic: no dose adjustments (has not been studied)  Drug-drug interactions: none  Screening: TB test: completed Hepatitis: completed  Monitoring: S/sx of infection: none  CBC:  Monitored by her PCP  S/sx of hypersensitivity/injection site reactions: none S/sx of malignancy: none  S/sx of heart failure: none   Other side effects: none reported   O:  Lab Results  Component Value Date   WBC 12.6 (H) 07/12/2020   HGB 10.2 (L) 07/12/2020   HCT 29.8 (L) 07/12/2020   MCV 95.2 07/12/2020   PLT 175 07/12/2020      Chemistry      Component Value Date/Time   NA 137 07/07/2020 0852   K 4.3 07/07/2020 0852   CL 104 07/07/2020 0852   CO2 24 07/07/2020 0852   BUN 13 07/07/2020 0852   CREATININE 0.66 07/07/2020 0852      Component Value Date/Time   CALCIUM 9.5 07/07/2020 0852       A/P: 1. Medication review: Patient currently on Humira for RA. Reviewed the medication with the patient, including the following: Humira is a TNF blocking agent indicated for ankylosing spondylitis, Crohn's disease, Hidradenitis suppurativa, psoriatic arthritis, plaque psoriasis, ulcerative colitis, and uveitis. Patient educated on purpose, proper use and potential adverse effects of Humira. Possible adverse effects are increased risk of infections, headache, and injection site reactions. There is the possibility of an increased risk of malignancy but it is not well understood if this increased risk is due to there medication or the disease state. There are rare cases of pancytopenia and aplastic anemia. For SubQ injection at separate  sites in the thigh or lower abdomen (avoiding areas within 2 inches of navel); rotate injection sites. May leave at room temperature for ~15 to 30 minutes prior to use; do not remove cap or cover while allowing product to reach room temperature. Do not use if solution is discolored or contains particulate matter. Do not administer to skin which is red, tender, bruised, hard, or that has scars, stretch marks, or psoriasis plaques. Needle cap of the prefilled syringe or needle cover for the adalimumab pen may contain latex. Prefilled pens and syringes are available for use by patients and the full amount of the syringe should be injected (self-administration); the vial is intended for institutional use only. Vials do not contain a preservative; discard unused portion. No recommendations for any changes at this time.  Benard Halsted, PharmD, [facility] [place], [place] [phone]

## 2021-05-01 ENCOUNTER — Other Ambulatory Visit (HOSPITAL_COMMUNITY): Payer: Self-pay

## 2021-05-11 ENCOUNTER — Other Ambulatory Visit: Payer: Self-pay | Admitting: Pharmacist

## 2021-05-11 ENCOUNTER — Other Ambulatory Visit (HOSPITAL_COMMUNITY): Payer: Self-pay

## 2021-05-11 MED ORDER — HUMIRA (2 PEN) 40 MG/0.4ML ~~LOC~~ AJKT: AUTO-INJECTOR | SUBCUTANEOUS | 6 refills | Status: DC

## 2021-05-11 MED ORDER — HUMIRA (2 PEN) 40 MG/0.4ML ~~LOC~~ AJKT
AUTO-INJECTOR | SUBCUTANEOUS | 6 refills | Status: DC
  Filled 2021-05-11: qty 4, 28d supply, fill #0
  Filled 2021-06-01: qty 4, 28d supply, fill #1

## 2021-05-12 ENCOUNTER — Other Ambulatory Visit (HOSPITAL_COMMUNITY): Payer: Self-pay

## 2021-05-12 MED ORDER — PREDNISONE 5 MG PO TABS
ORAL_TABLET | ORAL | 0 refills | Status: DC
  Filled 2021-05-12: qty 48, 12d supply, fill #0

## 2021-05-18 ENCOUNTER — Other Ambulatory Visit (HOSPITAL_COMMUNITY): Payer: Self-pay

## 2021-05-19 ENCOUNTER — Other Ambulatory Visit (HOSPITAL_COMMUNITY): Payer: Self-pay

## 2021-05-19 MED ORDER — ESCITALOPRAM OXALATE 10 MG PO TABS
10.0000 mg | ORAL_TABLET | Freq: Every day | ORAL | 0 refills | Status: DC
  Filled 2021-05-19: qty 60, 60d supply, fill #0

## 2021-05-20 ENCOUNTER — Other Ambulatory Visit (HOSPITAL_COMMUNITY): Payer: Self-pay

## 2021-05-20 DIAGNOSIS — Z6824 Body mass index (BMI) 24.0-24.9, adult: Secondary | ICD-10-CM | POA: Diagnosis not present

## 2021-05-20 DIAGNOSIS — N926 Irregular menstruation, unspecified: Secondary | ICD-10-CM | POA: Diagnosis not present

## 2021-05-20 DIAGNOSIS — F4322 Adjustment disorder with anxiety: Secondary | ICD-10-CM | POA: Diagnosis not present

## 2021-05-20 MED ORDER — ESCITALOPRAM OXALATE 10 MG PO TABS
10.0000 mg | ORAL_TABLET | Freq: Every evening | ORAL | 1 refills | Status: DC
  Filled 2021-05-20 – 2022-02-16 (×2): qty 90, 90d supply, fill #0

## 2021-05-20 MED ORDER — ALPRAZOLAM 0.5 MG PO TABS
0.5000 mg | ORAL_TABLET | Freq: Two times a day (BID) | ORAL | 2 refills | Status: DC
  Filled 2021-05-20: qty 60, 30d supply, fill #0

## 2021-06-01 ENCOUNTER — Other Ambulatory Visit (HOSPITAL_COMMUNITY): Payer: Self-pay

## 2021-06-04 ENCOUNTER — Other Ambulatory Visit (HOSPITAL_COMMUNITY): Payer: Self-pay

## 2021-06-09 ENCOUNTER — Other Ambulatory Visit (HOSPITAL_COMMUNITY): Payer: Self-pay

## 2021-06-11 DIAGNOSIS — M159 Polyosteoarthritis, unspecified: Secondary | ICD-10-CM | POA: Diagnosis not present

## 2021-06-11 DIAGNOSIS — M2550 Pain in unspecified joint: Secondary | ICD-10-CM | POA: Diagnosis not present

## 2021-06-11 DIAGNOSIS — Z79899 Other long term (current) drug therapy: Secondary | ICD-10-CM | POA: Diagnosis not present

## 2021-06-11 DIAGNOSIS — Z6824 Body mass index (BMI) 24.0-24.9, adult: Secondary | ICD-10-CM | POA: Diagnosis not present

## 2021-06-11 DIAGNOSIS — M0600 Rheumatoid arthritis without rheumatoid factor, unspecified site: Secondary | ICD-10-CM | POA: Diagnosis not present

## 2021-06-18 ENCOUNTER — Other Ambulatory Visit: Payer: Self-pay

## 2021-06-18 MED ORDER — CIMZIA 2 X 200 MG ~~LOC~~ KIT: PACK | SUBCUTANEOUS | 5 refills | Status: DC

## 2021-06-22 ENCOUNTER — Ambulatory Visit: Payer: 59 | Attending: Family Medicine | Admitting: Pharmacist

## 2021-06-22 ENCOUNTER — Other Ambulatory Visit (HOSPITAL_COMMUNITY): Payer: Self-pay

## 2021-06-22 ENCOUNTER — Other Ambulatory Visit: Payer: Self-pay

## 2021-06-22 DIAGNOSIS — Z7189 Other specified counseling: Secondary | ICD-10-CM

## 2021-06-22 MED ORDER — CIMZIA 2 X 200 MG ~~LOC~~ KIT
PACK | SUBCUTANEOUS | 5 refills | Status: DC
  Filled 2021-06-22: qty 2, fill #0
  Filled 2021-08-10: qty 2, 28d supply, fill #0

## 2021-06-22 NOTE — Progress Notes (Signed)
° °  S: Patient presents today for review of their specialty medication.   Patient is currently prescribed Cimzia (certolizumab) for RA. Patient is managed by Gavin Pound for this.   Dosing: Rheumatoid arthritis: SubQ: Initial: 400 mg, repeat dose 2 and 4 weeks after initial dose; Maintenance: 200 mg every other week. May consider maintenance dose of 400 mg every 4 weeks. May be administered alone or in combination with methotrexate.  Adherence: has not yet started   Efficacy:  has not yet started   Current adverse effects:  has not yet started. Tells me that her specialist covered the medication with her pretty thoroughly.    O:     Lab Results  Component Value Date   WBC 12.6 (H) 07/12/2020   HGB 10.2 (L) 07/12/2020   HCT 29.8 (L) 07/12/2020   MCV 95.2 07/12/2020   PLT 175 07/12/2020      Chemistry      Component Value Date/Time   NA 137 07/07/2020 0852   K 4.3 07/07/2020 0852   CL 104 07/07/2020 0852   CO2 24 07/07/2020 0852   BUN 13 07/07/2020 0852   CREATININE 0.66 07/07/2020 0852      Component Value Date/Time   CALCIUM 9.5 07/07/2020 0852       A/P: 1. Medication review: patient currently prescribed Cimzia for RA. Reviewed the medication with the patient, including the following: Cimzia is a TNF blocking agent used in the treatment of ankylosing spondylitis, axial spondyloarthritis, Crohn disease, plaque psoriasis, psoriatic arthritis, and rheumatoid arthritis. The medication should be room temp prior to administration an should be administered subcutaneously. Rotate injection sites and do not inject into damaged skin or into moles or scars. Possible adverse effects include GI upset, antibody development, increased risk of infection, hypersensitivity, demyelinating CNS disease, hematologic effects, and increased risk of malignancy. Use with caution in patients with heart failure. Avoid use of live vaccinations and let doctor know of any infections as treatment with  Cimzia may need to be held during illness. No recommendations for any changes.  Benard Halsted, PharmD, [facility] [place], [place] [phone]

## 2021-06-24 ENCOUNTER — Other Ambulatory Visit (HOSPITAL_COMMUNITY): Payer: Self-pay

## 2021-06-24 DIAGNOSIS — N3001 Acute cystitis with hematuria: Secondary | ICD-10-CM | POA: Diagnosis not present

## 2021-06-24 DIAGNOSIS — Z6824 Body mass index (BMI) 24.0-24.9, adult: Secondary | ICD-10-CM | POA: Diagnosis not present

## 2021-06-24 MED ORDER — NITROFURANTOIN MONOHYD MACRO 100 MG PO CAPS
100.0000 mg | ORAL_CAPSULE | Freq: Two times a day (BID) | ORAL | 0 refills | Status: DC
  Filled 2021-06-24: qty 14, 7d supply, fill #0

## 2021-07-07 ENCOUNTER — Ambulatory Visit: Payer: 59 | Admitting: Pharmacist

## 2021-07-09 ENCOUNTER — Other Ambulatory Visit (HOSPITAL_COMMUNITY): Payer: Self-pay

## 2021-07-09 ENCOUNTER — Other Ambulatory Visit: Payer: Self-pay

## 2021-07-09 MED ORDER — CIMZIA STARTER KIT 6 X 200 MG/ML ~~LOC~~ PSKT: PREFILLED_SYRINGE | SUBCUTANEOUS | 0 refills | Status: DC

## 2021-07-09 MED ORDER — ALBUTEROL SULFATE HFA 108 (90 BASE) MCG/ACT IN AERS
2.0000 | INHALATION_SPRAY | RESPIRATORY_TRACT | 3 refills | Status: DC
  Filled 2021-07-09 – 2021-07-10 (×2): qty 18, 25d supply, fill #0

## 2021-07-10 ENCOUNTER — Other Ambulatory Visit (HOSPITAL_COMMUNITY): Payer: Self-pay

## 2021-07-10 ENCOUNTER — Other Ambulatory Visit: Payer: Self-pay | Admitting: Pharmacist

## 2021-07-10 MED ORDER — CIMZIA STARTER KIT 6 X 200 MG/ML ~~LOC~~ PSKT
PREFILLED_SYRINGE | SUBCUTANEOUS | 0 refills | Status: DC
  Filled 2021-07-10: qty 6, 28d supply, fill #0
  Filled 2021-07-14: qty 6, 30d supply, fill #0
  Filled 2021-07-14: qty 3, 28d supply, fill #0

## 2021-07-13 ENCOUNTER — Other Ambulatory Visit (HOSPITAL_COMMUNITY): Payer: Self-pay

## 2021-07-14 ENCOUNTER — Other Ambulatory Visit (HOSPITAL_COMMUNITY): Payer: Self-pay

## 2021-07-15 ENCOUNTER — Other Ambulatory Visit (HOSPITAL_COMMUNITY): Payer: Self-pay

## 2021-07-23 ENCOUNTER — Other Ambulatory Visit (HOSPITAL_COMMUNITY): Payer: Self-pay

## 2021-08-08 ENCOUNTER — Other Ambulatory Visit (HOSPITAL_COMMUNITY): Payer: Self-pay

## 2021-08-10 ENCOUNTER — Other Ambulatory Visit: Payer: Self-pay | Admitting: Pharmacist

## 2021-08-10 ENCOUNTER — Other Ambulatory Visit (HOSPITAL_COMMUNITY): Payer: Self-pay

## 2021-08-10 ENCOUNTER — Other Ambulatory Visit: Payer: Self-pay

## 2021-08-10 MED ORDER — ESCITALOPRAM OXALATE 10 MG PO TABS
10.0000 mg | ORAL_TABLET | Freq: Every day | ORAL | 0 refills | Status: DC
  Filled 2021-08-10: qty 90, 90d supply, fill #0

## 2021-08-10 MED ORDER — CIMZIA PREFILLED 2 X 200 MG/ML ~~LOC~~ PSKT
PREFILLED_SYRINGE | SUBCUTANEOUS | 5 refills | Status: DC
  Filled 2021-08-10: qty 1, 28d supply, fill #0

## 2021-08-10 MED ORDER — CIMZIA PREFILLED 2 X 200 MG/ML ~~LOC~~ PSKT: PREFILLED_SYRINGE | SUBCUTANEOUS | 5 refills | Status: DC

## 2021-08-11 ENCOUNTER — Other Ambulatory Visit (HOSPITAL_COMMUNITY): Payer: Self-pay

## 2021-08-12 ENCOUNTER — Other Ambulatory Visit (HOSPITAL_COMMUNITY): Payer: Self-pay

## 2021-08-20 DIAGNOSIS — M545 Low back pain, unspecified: Secondary | ICD-10-CM | POA: Diagnosis not present

## 2021-08-20 DIAGNOSIS — Z6824 Body mass index (BMI) 24.0-24.9, adult: Secondary | ICD-10-CM | POA: Diagnosis not present

## 2021-09-10 ENCOUNTER — Other Ambulatory Visit (HOSPITAL_COMMUNITY): Payer: Self-pay

## 2021-09-10 DIAGNOSIS — Z6825 Body mass index (BMI) 25.0-25.9, adult: Secondary | ICD-10-CM | POA: Diagnosis not present

## 2021-09-10 DIAGNOSIS — M545 Low back pain, unspecified: Secondary | ICD-10-CM | POA: Diagnosis not present

## 2021-09-10 DIAGNOSIS — F4322 Adjustment disorder with anxiety: Secondary | ICD-10-CM | POA: Diagnosis not present

## 2021-09-10 DIAGNOSIS — J302 Other seasonal allergic rhinitis: Secondary | ICD-10-CM | POA: Diagnosis not present

## 2021-09-10 DIAGNOSIS — J45909 Unspecified asthma, uncomplicated: Secondary | ICD-10-CM | POA: Diagnosis not present

## 2021-09-10 MED ORDER — ALBUTEROL SULFATE HFA 108 (90 BASE) MCG/ACT IN AERS
2.0000 | INHALATION_SPRAY | RESPIRATORY_TRACT | 3 refills | Status: DC
  Filled 2021-09-10: qty 18, 25d supply, fill #0

## 2021-09-10 MED ORDER — MONTELUKAST SODIUM 10 MG PO TABS
10.0000 mg | ORAL_TABLET | Freq: Every day | ORAL | 1 refills | Status: DC
Start: 2021-09-10 — End: 2022-05-19
  Filled 2021-09-10: qty 90, 90d supply, fill #0
  Filled 2021-12-31: qty 90, 90d supply, fill #1

## 2021-09-10 MED ORDER — CIMZIA 2 X 200 MG ~~LOC~~ KIT: PACK | SUBCUTANEOUS | 5 refills | Status: DC

## 2021-09-10 MED ORDER — ALBUTEROL SULFATE HFA 108 (90 BASE) MCG/ACT IN AERS: 2.0000 | INHALATION_SPRAY | RESPIRATORY_TRACT | 3 refills | Status: AC

## 2021-09-14 ENCOUNTER — Other Ambulatory Visit: Payer: Self-pay | Admitting: Pharmacist

## 2021-09-14 ENCOUNTER — Other Ambulatory Visit (HOSPITAL_COMMUNITY): Payer: Self-pay

## 2021-09-14 MED ORDER — CIMZIA 2 X 200 MG/ML ~~LOC~~ PSKT
PREFILLED_SYRINGE | SUBCUTANEOUS | 5 refills | Status: DC
Start: 2021-09-14 — End: 2021-09-14
  Filled 2021-09-14: qty 2, fill #0

## 2021-09-14 MED ORDER — CIMZIA 2 X 200 MG ~~LOC~~ KIT
PACK | SUBCUTANEOUS | 5 refills | Status: DC
  Filled 2021-09-14: qty 2, 28d supply, fill #0

## 2021-09-14 MED ORDER — CIMZIA 2 X 200 MG/ML ~~LOC~~ PSKT: PREFILLED_SYRINGE | SUBCUTANEOUS | 5 refills | Status: DC

## 2021-09-14 MED ORDER — CIMZIA 2 X 200 MG ~~LOC~~ KIT
PACK | SUBCUTANEOUS | 5 refills | Status: DC
  Filled 2021-09-14: qty 2, fill #0

## 2021-09-15 ENCOUNTER — Other Ambulatory Visit: Payer: Self-pay | Admitting: Pharmacist

## 2021-09-15 ENCOUNTER — Other Ambulatory Visit (HOSPITAL_COMMUNITY): Payer: Self-pay

## 2021-09-15 MED ORDER — CIMZIA 2 X 200 MG/ML ~~LOC~~ PSKT
200.0000 mg | PREFILLED_SYRINGE | SUBCUTANEOUS | 5 refills | Status: DC
  Filled 2021-09-15: qty 1, 28d supply, fill #0
  Filled 2021-10-06: qty 1, 28d supply, fill #1
  Filled 2021-11-04: qty 1, 28d supply, fill #2
  Filled 2021-12-04: qty 1, 28d supply, fill #3
  Filled 2021-12-30 (×2): qty 1, 28d supply, fill #4
  Filled 2022-01-26: qty 1, 28d supply, fill #5
  Filled 2022-02-22: qty 1, 28d supply, fill #6
  Filled 2022-03-25: qty 1, 28d supply, fill #7
  Filled 2022-04-21: qty 1, 28d supply, fill #8
  Filled 2022-05-18: qty 1, 28d supply, fill #9
  Filled 2022-06-23: qty 1, 28d supply, fill #10
  Filled 2022-07-23: qty 1, 28d supply, fill #11

## 2021-09-16 ENCOUNTER — Other Ambulatory Visit (HOSPITAL_COMMUNITY): Payer: Self-pay

## 2021-09-23 DIAGNOSIS — M0600 Rheumatoid arthritis without rheumatoid factor, unspecified site: Secondary | ICD-10-CM | POA: Diagnosis not present

## 2021-09-23 DIAGNOSIS — Z6825 Body mass index (BMI) 25.0-25.9, adult: Secondary | ICD-10-CM | POA: Diagnosis not present

## 2021-09-23 DIAGNOSIS — R5383 Other fatigue: Secondary | ICD-10-CM | POA: Diagnosis not present

## 2021-09-23 DIAGNOSIS — M1991 Primary osteoarthritis, unspecified site: Secondary | ICD-10-CM | POA: Diagnosis not present

## 2021-09-23 DIAGNOSIS — E663 Overweight: Secondary | ICD-10-CM | POA: Diagnosis not present

## 2021-09-23 DIAGNOSIS — Z79899 Other long term (current) drug therapy: Secondary | ICD-10-CM | POA: Diagnosis not present

## 2021-10-01 DIAGNOSIS — Z79899 Other long term (current) drug therapy: Secondary | ICD-10-CM | POA: Diagnosis not present

## 2021-10-06 ENCOUNTER — Other Ambulatory Visit (HOSPITAL_COMMUNITY): Payer: Self-pay

## 2021-10-13 ENCOUNTER — Other Ambulatory Visit (HOSPITAL_COMMUNITY): Payer: Self-pay

## 2021-11-04 ENCOUNTER — Other Ambulatory Visit (HOSPITAL_COMMUNITY): Payer: Self-pay

## 2021-11-04 MED ORDER — DOXYCYCLINE MONOHYDRATE 100 MG PO TABS
100.0000 mg | ORAL_TABLET | Freq: Two times a day (BID) | ORAL | 0 refills | Status: DC
  Filled 2021-11-04: qty 20, 10d supply, fill #0

## 2021-11-10 ENCOUNTER — Other Ambulatory Visit (HOSPITAL_COMMUNITY): Payer: Self-pay

## 2021-11-19 ENCOUNTER — Other Ambulatory Visit (HOSPITAL_COMMUNITY): Payer: Self-pay

## 2021-11-19 MED ORDER — ESCITALOPRAM OXALATE 10 MG PO TABS
10.0000 mg | ORAL_TABLET | Freq: Every day | ORAL | 1 refills | Status: DC
  Filled 2021-11-19: qty 90, 90d supply, fill #0

## 2021-11-20 ENCOUNTER — Other Ambulatory Visit (HOSPITAL_COMMUNITY): Payer: Self-pay

## 2021-11-25 ENCOUNTER — Ambulatory Visit (INDEPENDENT_AMBULATORY_CARE_PROVIDER_SITE_OTHER): Payer: 59

## 2021-11-25 ENCOUNTER — Ambulatory Visit: Payer: 59 | Admitting: Orthopaedic Surgery

## 2021-11-25 DIAGNOSIS — G8929 Other chronic pain: Secondary | ICD-10-CM | POA: Diagnosis not present

## 2021-11-25 DIAGNOSIS — M25551 Pain in right hip: Secondary | ICD-10-CM

## 2021-11-25 DIAGNOSIS — M25562 Pain in left knee: Secondary | ICD-10-CM

## 2021-11-25 MED ORDER — METHYLPREDNISOLONE ACETATE 40 MG/ML IJ SUSP
40.0000 mg | INTRAMUSCULAR | Status: AC | PRN
  Administered 2021-11-25: 40 mg via INTRA_ARTICULAR

## 2021-11-25 MED ORDER — LIDOCAINE HCL 1 % IJ SOLN
3.0000 mL | INTRAMUSCULAR | Status: AC | PRN
  Administered 2021-11-25: 3 mL

## 2021-11-25 NOTE — Progress Notes (Signed)
Office Visit Note   Patient: Alice Klein           Date of Birth: [birth year]           MRN: [id number] Visit Date: 11/25/2021              Requested by: Serita Grammes, MD [address] [place],  [place] [address] PCP: Serita Grammes, MD   Assessment & Plan: Visit Diagnoses:  1. Chronic pain of left knee   2. Pain in right hip     Plan: There is concerned that she may have a lateral meniscal tear of her left knee.  I did feel it was reasonable to try a steroid injection in her left knee as well as 1 in her right hip trochanteric area.  She agreed to these and tolerated them well.  I would like to see her back in 2 weeks for repeat exam of her left knee to determine whether or not a MRI would be warranted.  Follow-Up Instructions: Return in about 2 weeks (around 12/09/2021).   Orders:  Orders Placed This Encounter  Procedures   Large Joint Inj   Large Joint Inj   XR HIP UNILAT W OR W/O PELVIS 1V RIGHT   XR Knee 1-2 Views Left   No orders of the defined types were placed in this encounter.     Procedures: Large Joint Inj: L knee on 11/25/2021 3:37 PM Indications: diagnostic evaluation and pain Details: 22 G 1.5 in needle, superolateral approach  Arthrogram: No  Medications: 3 mL lidocaine 1 %; 40 mg methylPREDNISolone acetate 40 MG/ML Outcome: tolerated well, no immediate complications Procedure, treatment alternatives, risks and benefits explained, specific risks discussed. Consent was given by the patient. Immediately prior to procedure a time out was called to verify the correct patient, procedure, equipment, support staff and site/side marked as required. Patient was prepped and draped in the usual sterile fashion.    Large Joint Inj: R greater trochanter on 11/25/2021 3:37 PM Indications: pain and diagnostic evaluation Details: 22 G 1.5 in needle, lateral approach  Arthrogram: No  Medications: 3 mL lidocaine 1 %; 40 mg methylPREDNISolone acetate 40  MG/ML Outcome: tolerated well, no immediate complications Procedure, treatment alternatives, risks and benefits explained, specific risks discussed. Consent was given by the patient. Immediately prior to procedure a time out was called to verify the correct patient, procedure, equipment, support staff and site/side marked as required. Patient was prepped and draped in the usual sterile fashion.      Clinical Data: No additional findings.   Subjective: Chief Complaint  Patient presents with   Right Hip - Pain   Left Knee - Pain  The patient is well-known to me.  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  That left hip was done great but now her left knee is bothering her on the lateral aspect of her left knee and the lateral aspect of her right hip.  She denies any leg length discrepancy.  She does report bilateral knee patella crepitation but is certainly become worse on the left side with no known injury.  It has been really swelling on her involving her quite a bit with exercises and activities.  She denies any groin pain on either side.  HPI  Review of  Systems There is no listed fever, chills, nausea, vomiting  Objective: Vital Signs: There were no vitals taken for this visit.  Physical Exam She is alert and orient x3 and in no acute distress Ortho Exam Examination of her right hip shows it moves smoothly and fluidly.  There is only pain over the trochanteric area and the proximal IT band.  Both knees actually hyperextend and show significant patellofemoral crepitation.  However I am feeling more of a click on the left knee and she has lateral compartment pain and lateral joint line tenderness as well as irritation of the meniscus with McMurray's exam on the left side.  Her Lachman's is negative bilaterally.  Her  left hip moves smoothly and fluidly. Specialty Comments:  No specialty comments available.  Imaging: XR HIP UNILAT W OR W/O PELVIS 1V RIGHT  Result Date: 11/25/2021 An AP pelvis and lateral the right hip shows no acute findings of the right hip.  The joint space is well-maintained.  There is a left total hip arthroplasty that appears well-seated with no complicating features.  XR Knee 1-2 Views Left  Result Date: 11/25/2021 An AP and lateral left knee show no acute findings.  There is slight narrowing of the patellofemoral joint.  The medial lateral compartments are well-maintained.    PMFS History: Patient Active Problem List   Diagnosis Date Noted   Status post hip replacement 07/11/2020   Unilateral primary osteoarthritis, left hip 07/10/2020   Palpitations 08/23/2019   Past Medical History:  Diagnosis Date   Allergies    Anxiety    Chronic kidney disease    uritral re-implantation   GERD (gastroesophageal reflux disease)    Headache    Palpitations 08/23/2019   Pneumonia    Reactive airway disease    Seronegative rheumatoid arthritis (HCC)    SVT (supraventricular tachycardia) (HCC)    UTI (urinary tract infection)     Family History  Problem Relation Age of Onset   Colon polyps Mother    Hypertension Mother    Hypertension Father    Stroke Brother    Colon cancer Neg Hx     Past Surgical History:  Procedure Laterality Date   DILATION AND CURETTAGE OF UTERUS  2009   ESOPHAGOGASTRODUODENOSCOPY  10/29/2013   Normal EGD   HIP ARTHROPLASTY Left    INTRAUTERINE DEVICE (IUD) INSERTION  02/2009   OTHER SURGICAL HISTORY     Ureteral Surgery    SHOULDER ARTHROSCOPY Right    TONSILLECTOMY     TOTAL HIP ARTHROPLASTY Left 07/11/2020   Procedure: LEFT TOTAL HIP ARTHROPLASTY ANTERIOR APPROACH;  Surgeon: Mcarthur Rossetti, MD;  Location: [facility];  Service: Orthopedics;  Laterality: Left;   Social History   Occupational History   Not on file  Tobacco Use   Smoking  status: Former    Types: Cigarettes    Quit date: 2004    Years since quitting: 19.4   Smokeless tobacco: Never  Vaping Use   Vaping Use: Never used  Substance and Sexual Activity   Alcohol use: Yes    Alcohol/week: 3.0 standard drinks    Types: 3 Glasses of wine per week    Comment: weekly   Drug use: Never   Sexual activity: Yes

## 2021-12-04 ENCOUNTER — Other Ambulatory Visit (HOSPITAL_COMMUNITY): Payer: Self-pay

## 2021-12-07 ENCOUNTER — Encounter: Payer: Self-pay | Admitting: Physician Assistant

## 2021-12-07 ENCOUNTER — Ambulatory Visit: Payer: 59 | Admitting: Physician Assistant

## 2021-12-07 DIAGNOSIS — Z0000 Encounter for general adult medical examination without abnormal findings: Secondary | ICD-10-CM | POA: Diagnosis not present

## 2021-12-07 DIAGNOSIS — M25551 Pain in right hip: Secondary | ICD-10-CM

## 2021-12-07 DIAGNOSIS — M25562 Pain in left knee: Secondary | ICD-10-CM | POA: Diagnosis not present

## 2021-12-07 DIAGNOSIS — Z1331 Encounter for screening for depression: Secondary | ICD-10-CM | POA: Diagnosis not present

## 2021-12-07 DIAGNOSIS — G8929 Other chronic pain: Secondary | ICD-10-CM

## 2021-12-07 DIAGNOSIS — Z6825 Body mass index (BMI) 25.0-25.9, adult: Secondary | ICD-10-CM | POA: Diagnosis not present

## 2021-12-07 NOTE — Progress Notes (Signed)
HPI: Alice Klein returns today for follow-up of her left knee and right hip.  She states right hip pain 75% better after the tract injection.  Regards to her knee she states is 25% better.  She is having some sensation crepitus in pain in the anterior aspect of the knee worse with going up stairs.  Otherwise no mechanical symptoms in the knee.  She is taking Celebrex or ibuprofen as needed.  Using Voltaren gel over the knee she has tried no patellar knee brace.   Review of systems: See HPI  Physical exam: Left knee good range of motion.  Patellofemoral crepitus.  No tenderness along the lateral joint line.  Patella tracks laterally.  No gross instability valgus varus stressing.  Impression: Right trochanteric bursitis improved Left knee pain  Plan: She will continue to work on IT band stretching exercises.  In regards to the knee offered MRI rule out meniscal tear she defers.  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  Knee friendly exercises discussed.  Follow-up visit as needed pain persist becomes worse or she develops any mechanical symptoms.

## 2021-12-09 ENCOUNTER — Other Ambulatory Visit (HOSPITAL_COMMUNITY): Payer: Self-pay

## 2021-12-29 ENCOUNTER — Other Ambulatory Visit (HOSPITAL_COMMUNITY): Payer: Self-pay

## 2021-12-30 ENCOUNTER — Other Ambulatory Visit (HOSPITAL_COMMUNITY): Payer: Self-pay

## 2021-12-30 DIAGNOSIS — E7800 Pure hypercholesterolemia, unspecified: Secondary | ICD-10-CM | POA: Diagnosis not present

## 2021-12-30 DIAGNOSIS — R635 Abnormal weight gain: Secondary | ICD-10-CM | POA: Diagnosis not present

## 2021-12-30 DIAGNOSIS — N951 Menopausal and female climacteric states: Secondary | ICD-10-CM | POA: Diagnosis not present

## 2021-12-30 DIAGNOSIS — Z6825 Body mass index (BMI) 25.0-25.9, adult: Secondary | ICD-10-CM | POA: Diagnosis not present

## 2021-12-31 ENCOUNTER — Other Ambulatory Visit (HOSPITAL_COMMUNITY): Payer: Self-pay

## 2022-01-01 ENCOUNTER — Other Ambulatory Visit (HOSPITAL_COMMUNITY): Payer: Self-pay

## 2022-01-04 ENCOUNTER — Other Ambulatory Visit (HOSPITAL_COMMUNITY): Payer: Self-pay

## 2022-01-04 DIAGNOSIS — N951 Menopausal and female climacteric states: Secondary | ICD-10-CM | POA: Diagnosis not present

## 2022-01-04 DIAGNOSIS — E7800 Pure hypercholesterolemia, unspecified: Secondary | ICD-10-CM | POA: Diagnosis not present

## 2022-01-04 DIAGNOSIS — F5101 Primary insomnia: Secondary | ICD-10-CM | POA: Diagnosis not present

## 2022-01-04 DIAGNOSIS — Z1331 Encounter for screening for depression: Secondary | ICD-10-CM | POA: Diagnosis not present

## 2022-01-04 DIAGNOSIS — Z6825 Body mass index (BMI) 25.0-25.9, adult: Secondary | ICD-10-CM | POA: Diagnosis not present

## 2022-01-04 DIAGNOSIS — R635 Abnormal weight gain: Secondary | ICD-10-CM | POA: Diagnosis not present

## 2022-01-04 DIAGNOSIS — F419 Anxiety disorder, unspecified: Secondary | ICD-10-CM | POA: Diagnosis not present

## 2022-01-04 DIAGNOSIS — Z1339 Encounter for screening examination for other mental health and behavioral disorders: Secondary | ICD-10-CM | POA: Diagnosis not present

## 2022-01-05 ENCOUNTER — Other Ambulatory Visit (HOSPITAL_COMMUNITY): Payer: Self-pay

## 2022-01-08 ENCOUNTER — Other Ambulatory Visit (HOSPITAL_COMMUNITY): Payer: Self-pay

## 2022-01-11 ENCOUNTER — Other Ambulatory Visit (HOSPITAL_COMMUNITY): Payer: Self-pay

## 2022-01-11 MED ORDER — ALPRAZOLAM 0.5 MG PO TABS
0.5000 mg | ORAL_TABLET | Freq: Two times a day (BID) | ORAL | 2 refills | Status: DC
  Filled 2022-01-11: qty 60, 30d supply, fill #0

## 2022-01-14 ENCOUNTER — Other Ambulatory Visit (HOSPITAL_COMMUNITY): Payer: Self-pay

## 2022-01-26 ENCOUNTER — Other Ambulatory Visit (HOSPITAL_COMMUNITY): Payer: Self-pay

## 2022-02-02 ENCOUNTER — Other Ambulatory Visit (HOSPITAL_COMMUNITY): Payer: Self-pay

## 2022-02-03 ENCOUNTER — Other Ambulatory Visit (HOSPITAL_COMMUNITY): Payer: Self-pay

## 2022-02-16 ENCOUNTER — Other Ambulatory Visit (HOSPITAL_COMMUNITY): Payer: Self-pay

## 2022-02-17 ENCOUNTER — Other Ambulatory Visit (HOSPITAL_COMMUNITY): Payer: Self-pay

## 2022-02-17 MED ORDER — CELECOXIB 200 MG PO CAPS
200.0000 mg | ORAL_CAPSULE | Freq: Two times a day (BID) | ORAL | 0 refills | Status: DC
  Filled 2022-02-17: qty 180, 90d supply, fill #0

## 2022-02-22 ENCOUNTER — Other Ambulatory Visit (HOSPITAL_COMMUNITY): Payer: Self-pay

## 2022-02-25 ENCOUNTER — Other Ambulatory Visit (HOSPITAL_COMMUNITY): Payer: Self-pay

## 2022-02-26 DIAGNOSIS — D2271 Melanocytic nevi of right lower limb, including hip: Secondary | ICD-10-CM | POA: Diagnosis not present

## 2022-02-26 DIAGNOSIS — L570 Actinic keratosis: Secondary | ICD-10-CM | POA: Diagnosis not present

## 2022-03-25 ENCOUNTER — Other Ambulatory Visit (HOSPITAL_COMMUNITY): Payer: Self-pay

## 2022-03-30 ENCOUNTER — Other Ambulatory Visit (HOSPITAL_COMMUNITY): Payer: Self-pay

## 2022-04-01 ENCOUNTER — Other Ambulatory Visit (HOSPITAL_COMMUNITY): Payer: Self-pay

## 2022-04-01 DIAGNOSIS — Z6824 Body mass index (BMI) 24.0-24.9, adult: Secondary | ICD-10-CM | POA: Diagnosis not present

## 2022-04-01 DIAGNOSIS — M0600 Rheumatoid arthritis without rheumatoid factor, unspecified site: Secondary | ICD-10-CM | POA: Diagnosis not present

## 2022-04-01 DIAGNOSIS — M1991 Primary osteoarthritis, unspecified site: Secondary | ICD-10-CM | POA: Diagnosis not present

## 2022-04-01 DIAGNOSIS — Z79899 Other long term (current) drug therapy: Secondary | ICD-10-CM | POA: Diagnosis not present

## 2022-04-01 MED ORDER — LEFLUNOMIDE 20 MG PO TABS
20.0000 mg | ORAL_TABLET | Freq: Every day | ORAL | 3 refills | Status: DC
  Filled 2022-04-01: qty 30, 30d supply, fill #0
  Filled 2022-05-01: qty 30, 30d supply, fill #1
  Filled 2022-05-29 – 2022-06-03 (×2): qty 30, 30d supply, fill #2
  Filled 2022-07-03 – 2022-07-20 (×2): qty 30, 30d supply, fill #3

## 2022-04-21 ENCOUNTER — Other Ambulatory Visit (HOSPITAL_COMMUNITY): Payer: Self-pay

## 2022-04-26 ENCOUNTER — Ambulatory Visit: Payer: 59 | Attending: Family Medicine | Admitting: Pharmacist

## 2022-04-26 DIAGNOSIS — Z7189 Other specified counseling: Secondary | ICD-10-CM

## 2022-04-26 NOTE — Progress Notes (Signed)
   S: Patient presents today for review of their specialty medication.   Patient is currently prescribed Cimzia (certolizumab) for RA. Patient is managed by Gavin Pound for this.   Dosing: Rheumatoid arthritis: SubQ: Initial: 400 mg, repeat dose 2 and 4 weeks after initial dose; Maintenance: 200 mg every other week. May consider maintenance dose of 400 mg every 4 weeks. May be administered alone or in combination with methotrexate.  Adherence: confirms  Efficacy:  believes the medication is working okay for her.   Current adverse effects:  some injection site pain but otherwise well-tolerated    O:     Lab Results  Component Value Date   WBC 12.6 (H) 07/12/2020   HGB 10.2 (L) 07/12/2020   HCT 29.8 (L) 07/12/2020   MCV 95.2 07/12/2020   PLT 175 07/12/2020      Chemistry      Component Value Date/Time   NA 137 07/07/2020 0852   K 4.3 07/07/2020 0852   CL 104 07/07/2020 0852   CO2 24 07/07/2020 0852   BUN 13 07/07/2020 0852   CREATININE 0.66 07/07/2020 0852      Component Value Date/Time   CALCIUM 9.5 07/07/2020 0852       A/P: 1. Medication review: patient currently taking Cimzia for RA. Reviewed the medication with the patient, including the following: Cimzia is a TNF blocking agent used in the treatment of ankylosing spondylitis, axial spondyloarthritis, Crohn disease, plaque psoriasis, psoriatic arthritis, and rheumatoid arthritis. The medication should be room temp prior to administration an should be administered subcutaneously. Rotate injection sites and do not inject into damaged skin or into moles or scars. Possible adverse effects include GI upset, antibody development, increased risk of infection, hypersensitivity, demyelinating CNS disease, hematologic effects, and increased risk of malignancy. Use with caution in patients with heart failure. Avoid use of live vaccinations and let doctor know of any infections as treatment with Cimzia may need to be held during  illness. No recommendations for any changes.  Benard Halsted, PharmD, [facility], [place] [phone]

## 2022-04-28 ENCOUNTER — Other Ambulatory Visit (HOSPITAL_COMMUNITY): Payer: Self-pay

## 2022-04-30 DIAGNOSIS — M0600 Rheumatoid arthritis without rheumatoid factor, unspecified site: Secondary | ICD-10-CM | POA: Diagnosis not present

## 2022-05-01 ENCOUNTER — Other Ambulatory Visit (HOSPITAL_COMMUNITY): Payer: Self-pay

## 2022-05-03 ENCOUNTER — Other Ambulatory Visit (HOSPITAL_COMMUNITY): Payer: Self-pay

## 2022-05-13 DIAGNOSIS — S82831A Other fracture of upper and lower end of right fibula, initial encounter for closed fracture: Secondary | ICD-10-CM | POA: Diagnosis not present

## 2022-05-14 ENCOUNTER — Ambulatory Visit (INDEPENDENT_AMBULATORY_CARE_PROVIDER_SITE_OTHER): Payer: 59

## 2022-05-14 ENCOUNTER — Encounter: Payer: Self-pay | Admitting: Physician Assistant

## 2022-05-14 ENCOUNTER — Ambulatory Visit: Payer: 59 | Admitting: Physician Assistant

## 2022-05-14 DIAGNOSIS — S82891A Other fracture of right lower leg, initial encounter for closed fracture: Secondary | ICD-10-CM | POA: Diagnosis not present

## 2022-05-14 NOTE — Progress Notes (Signed)
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  She was out feeding her horse yesterday when she stepped in a hole that was hidden by leaves and twisted her right ankle.  She heard and felt a pop and had difficulty bearing weight.  She was seen in an urgent care yesterday and was told she had an ankle fracture.  She was placed in a short cam walker boot.  Still having difficulty bearing weight.  Denies any previous injury  Assessment & Plan: Visit Diagnoses:  1. Closed fracture of right ankle, initial encounter     Plan: Findings consistent with a Weber a nondisplaced distal fibula fracture.  She has well-maintained alignment through the mortise and does not have any proximal pain in her fibula.  No tenderness over the medial ankle.  No tenderness over the foot.  Recommend immobilization in a tall her boot which she has at home.  Should refrain from weightbearing this week use crutches we will follow-up with Carney Bern who she normally sees.  Follow-Up Instructions: No follow-ups on file.   Ortho Exam  Patient is alert, oriented, no adenopathy, well-dressed, normal affect, normal respiratory effort. Examination of her right ankle she has moderate soft tissue swelling isolated to the lateral side of the ankle.  She has minimal swelling medially no tenderness to palpation over the proximal fibula.  She is very tender to palpation over the distal fibula.  She is ever to activate her peroneal tendons posterior tib tendon she is able to dorsiflex and plantarflex and all of this is just limited by pain.  No  tenderness with manipulation to the midfoot she is neurovascular intact  Imaging: XR Ankle Complete Right  Result Date: 05/14/2022 Three-view radiographs of her ankle were obtained today.  She does have a nondisplaced Weber a distal fibula fracture.  She is well-maintained through the mortise.  She does have ostial formation on the lateral x-ray of the talonavicular joint with broken osteophyte this is not acute.  No other obvious osseous findings  No images are attached to the encounter.  Labs: No results found for: "HGBA1C", "ESRSEDRATE", "CRP", "LABURIC", "REPTSTATUS", "GRAMSTAIN", "CULT", "LABORGA"   No results found for: "ALBUMIN", "PREALBUMIN", "CBC"  No results found for: "MG" No results found for: "VD25OH"  No results found for: "PREALBUMIN"    Latest Ref Rng & Units 07/12/2020    3:07 AM 07/07/2020    8:52 AM  CBC EXTENDED  WBC 4.0 - 10.5 K/uL 12.6  7.3   RBC 3.87 - 5.11 MIL/uL 3.13  3.96   Hemoglobin 12.0 - 15.0 g/dL 10.2  12.8   HCT 36.0 - 46.0 % 29.8  37.6   Platelets 150 - 400 K/uL 175  225      There is no height or weight on file to calculate BMI.  Orders:  Orders Placed This Encounter  Procedures   XR Ankle Complete Right   No orders  of the defined types were placed in this encounter.    Procedures: No procedures performed  Clinical Data: No additional findings.  ROS:  All other systems negative, except as noted in the HPI. Review of Systems  Objective: Vital Signs: There were no vitals taken for this visit.  Specialty Comments:  No specialty comments available.  PMFS History: Patient Active Problem List   Diagnosis Date Noted   Closed right ankle fracture 05/14/2022   Status post hip replacement 07/11/2020   Unilateral primary osteoarthritis, left hip 07/10/2020   Palpitations 08/23/2019   Past Medical History:  Diagnosis Date   Allergies    Anxiety    Chronic kidney disease    uritral re-implantation   GERD (gastroesophageal  reflux disease)    Headache    Palpitations 08/23/2019   Pneumonia    Reactive airway disease    Seronegative rheumatoid arthritis (HCC)    SVT (supraventricular tachycardia)    UTI (urinary tract infection)     Family History  Problem Relation Age of Onset   Colon polyps Mother    Hypertension Mother    Hypertension Father    Stroke Brother    Colon cancer Neg Hx     Past Surgical History:  Procedure Laterality Date   DILATION AND CURETTAGE OF UTERUS  2009   ESOPHAGOGASTRODUODENOSCOPY  10/29/2013   Normal EGD   HIP ARTHROPLASTY Left    INTRAUTERINE DEVICE (IUD) INSERTION  02/2009   OTHER SURGICAL HISTORY     Ureteral Surgery    SHOULDER ARTHROSCOPY Right    TONSILLECTOMY     TOTAL HIP ARTHROPLASTY Left 07/11/2020   Procedure: LEFT TOTAL HIP ARTHROPLASTY ANTERIOR APPROACH;  Surgeon: Mcarthur Rossetti, MD;  Location: [facility];  Service: Orthopedics;  Laterality: Left;   Social History   Occupational History   Not on file  Tobacco Use   Smoking status: Former    Types: Cigarettes    Quit date: 2004    Years since quitting: 19.8   Smokeless tobacco: Never  Vaping Use   Vaping Use: Never used  Substance and Sexual Activity   Alcohol use: Yes    Alcohol/week: 3.0 standard drinks of alcohol    Types: 3 Glasses of wine per week    Comment: weekly   Drug use: Never   Sexual activity: Yes

## 2022-05-18 ENCOUNTER — Telehealth: Payer: Self-pay | Admitting: Physician Assistant

## 2022-05-18 ENCOUNTER — Other Ambulatory Visit (HOSPITAL_COMMUNITY): Payer: Self-pay

## 2022-05-18 NOTE — Telephone Encounter (Signed)
[employer] forms received. To [employer]. 

## 2022-05-19 ENCOUNTER — Ambulatory Visit: Payer: 59 | Admitting: Orthopaedic Surgery

## 2022-05-19 ENCOUNTER — Ambulatory Visit (INDEPENDENT_AMBULATORY_CARE_PROVIDER_SITE_OTHER): Payer: 59

## 2022-05-19 ENCOUNTER — Encounter: Payer: Self-pay | Admitting: Orthopaedic Surgery

## 2022-05-19 ENCOUNTER — Other Ambulatory Visit (HOSPITAL_COMMUNITY): Payer: Self-pay

## 2022-05-19 ENCOUNTER — Telehealth: Payer: Self-pay | Admitting: Orthopaedic Surgery

## 2022-05-19 DIAGNOSIS — M25571 Pain in right ankle and joints of right foot: Secondary | ICD-10-CM

## 2022-05-19 MED ORDER — MONTELUKAST SODIUM 10 MG PO TABS
10.0000 mg | ORAL_TABLET | Freq: Every day | ORAL | 1 refills | Status: DC
  Filled 2022-05-19: qty 90, 90d supply, fill #0
  Filled 2022-08-12: qty 90, 90d supply, fill #1

## 2022-05-19 NOTE — Progress Notes (Signed)
Office Visit Note   Patient: Alice Klein           Date of Birth: 08-26-1975           MRN: [id number] Visit Date: 05/19/2022              Requested by: Serita Grammes, MD [address] [place],  [place] [address] PCP: Serita Grammes, MD   Assessment & Plan: Visit Diagnoses:  1. Pain in right ankle and joints of right foot     Plan: We will have her follow-up in 4 weeks for repeat radiographs.  She will remain in a cam walker boot at least for the next week to 2 weeks depending upon pain.  She is weightbearing as tolerated.  She can can transition to the ASO brace and a week to a week and a half as pain allows.  She will wear this whenever up ambulating.  Questions were encouraged and answered at length.  Did ask her to go on 81 mg aspirin during the time that she is nonweightbearing due to pain.  Follow-Up Instructions: Return in about 4 weeks (around 06/16/2022).   Orders:  Orders Placed This Encounter  Procedures   XR Ankle Complete Right   No orders of the defined types were placed in this encounter.     Procedures: No procedures performed   Clinical Data: No additional findings.   Subjective: Chief Complaint  Patient presents with   Right Ankle - Follow-up    HPI Mana returns today due to right ankle injury.  She reports last Thursday she rolled her ankle while feeding her horse.  She states that she stepped in a hole that was filled with leaves and twisted her right ankle.  She was seen here in our office is found to have a nondisplaced Weber a distal fibula fracture.  She is placed in a boot.  She is here today for follow-up.  She has been ambulating with a crutch.  She has been unable to bear weight on the ankle.  She does note that she has had several falls since she was seen last Thursday.  She is unsure if she injured the ankle but was in the boot during these falls.  Review of Systems See HPI  Objective: Vital Signs: There were no vitals  taken for this visit.  Physical Exam Constitutional:      Appearance: She is not ill-appearing or diaphoretic.  Cardiovascular:     Pulses: Normal pulses.  Neurological:     Mental Status: She is alert and oriented to person, place, and time.  Psychiatric:        Mood and Affect: Mood normal.     Ortho Exam Right lower extremity calf supple nontender.  No tenderness over the proximal tib-fib.  Significant ecchymosis about the lateral malleolus and going into the dorsal aspect of the right foot.  Slight tenderness over the medial malleolus significant tenderness over the lateral malleolus no gross deformity of the ankle.  Specialty Comments:  No specialty comments available.  Imaging: XR Ankle Complete Right  Result Date: 05/19/2022 Right ankle 3 views: Talus well located within the ankle mortise no diastases.  Lateral malleolus fracture consistent with a Weber a fracture line remains nondisplaced.  No other acute fractures or acute injuries.    PMFS History: Patient Active Problem List   Diagnosis Date Noted   Closed right ankle fracture 05/14/2022   Status post hip replacement 07/11/2020   Unilateral primary  osteoarthritis, left hip 07/10/2020   Palpitations 08/23/2019   Past Medical History:  Diagnosis Date   Allergies    Anxiety    Chronic kidney disease    uritral re-implantation   GERD (gastroesophageal reflux disease)    Headache    Palpitations 08/23/2019   Pneumonia    Reactive airway disease    Seronegative rheumatoid arthritis (HCC)    SVT (supraventricular tachycardia)    UTI (urinary tract infection)     Family History  Problem Relation Age of Onset   Colon polyps Mother    Hypertension Mother    Hypertension Father    Stroke Brother    Colon cancer Neg Hx     Past Surgical History:  Procedure Laterality Date   DILATION AND CURETTAGE OF UTERUS  2009   ESOPHAGOGASTRODUODENOSCOPY  10/29/2013   Normal EGD   HIP ARTHROPLASTY Left    INTRAUTERINE  DEVICE (IUD) INSERTION  02/2009   OTHER SURGICAL HISTORY     Ureteral Surgery    SHOULDER ARTHROSCOPY Right    TONSILLECTOMY     TOTAL HIP ARTHROPLASTY Left 07/11/2020   Procedure: LEFT TOTAL HIP ARTHROPLASTY ANTERIOR APPROACH;  Surgeon: Mcarthur Rossetti, MD;  Location: [facility];  Service: Orthopedics;  Laterality: Left;   Social History   Occupational History   Not on file  Tobacco Use   Smoking status: Former    Types: Cigarettes    Quit date: 2004    Years since quitting: 19.8   Smokeless tobacco: Never  Vaping Use   Vaping Use: Never used  Substance and Sexual Activity   Alcohol use: Yes    Alcohol/week: 3.0 standard drinks of alcohol    Types: 3 Glasses of wine per week    Comment: weekly   Drug use: Never   Sexual activity: Yes

## 2022-05-19 NOTE — Telephone Encounter (Addendum)
Received $25.00 check and medical records release form/ Forwarding to [place] today

## 2022-05-31 ENCOUNTER — Telehealth: Payer: Self-pay | Admitting: Orthopaedic Surgery

## 2022-05-31 ENCOUNTER — Other Ambulatory Visit (HOSPITAL_COMMUNITY): Payer: Self-pay

## 2022-05-31 NOTE — Telephone Encounter (Signed)
Note faxed to [employer] [phone]

## 2022-05-31 NOTE — Telephone Encounter (Signed)
Pt states this Sunday's date. Can you please email to [email or web address]. Pt phone number is [phone].

## 2022-05-31 NOTE — Telephone Encounter (Signed)
LMOM for patient to give [place] a call back and let is know how long she needs to be out

## 2022-05-31 NOTE — Telephone Encounter (Signed)
Patient needs an extended work note. Best number to contact [phone]

## 2022-06-02 ENCOUNTER — Other Ambulatory Visit (HOSPITAL_COMMUNITY): Payer: Self-pay

## 2022-06-03 ENCOUNTER — Other Ambulatory Visit (HOSPITAL_COMMUNITY): Payer: Self-pay

## 2022-06-09 DIAGNOSIS — M0600 Rheumatoid arthritis without rheumatoid factor, unspecified site: Secondary | ICD-10-CM | POA: Diagnosis not present

## 2022-06-09 DIAGNOSIS — Z79899 Other long term (current) drug therapy: Secondary | ICD-10-CM | POA: Diagnosis not present

## 2022-06-09 DIAGNOSIS — M1991 Primary osteoarthritis, unspecified site: Secondary | ICD-10-CM | POA: Diagnosis not present

## 2022-06-09 DIAGNOSIS — Z6824 Body mass index (BMI) 24.0-24.9, adult: Secondary | ICD-10-CM | POA: Diagnosis not present

## 2022-06-16 ENCOUNTER — Encounter: Payer: Self-pay | Admitting: Orthopaedic Surgery

## 2022-06-16 ENCOUNTER — Ambulatory Visit: Payer: 59 | Admitting: Orthopaedic Surgery

## 2022-06-16 ENCOUNTER — Ambulatory Visit (INDEPENDENT_AMBULATORY_CARE_PROVIDER_SITE_OTHER): Payer: 59

## 2022-06-16 DIAGNOSIS — M25571 Pain in right ankle and joints of right foot: Secondary | ICD-10-CM

## 2022-06-16 NOTE — Progress Notes (Signed)
  HPI: Alice Klein comes in today for follow-up of her right lateral malleolus fracture.  She states she has been wearing the ASO brace some but at work she has mainly been wearing her cam walker boot.  She states the brace becomes uncomfortable after about an hour.  She has had no new injuries.  She notes her bruising is improved.  Physical exam: Right ankle tenderness over the lateral malleolus.  No gross deformity.  Ecchymosis over the medial malleolus.  Foot otherwise neurovascularly intact.  No rashes skin lesions ulcerations or impending ulcers.  Radiographs: Right ankle 3 views: Lateral malleolus fracture remains nondisplaced.  No other fractures identified.  Talus well located within the ankle mortise.  No diastases  Impression: Right ankle lateral malleolus fracture nondisplaced  Plan: She is weightbearing as tolerated cam walker boot or ASO brace.  Will see her back in 1 month for repeat radiographs of the right ankle 3 views.  She was given moleskin to help plied to the ASO brace to hopefully help with the rubbing over the lateral malleolus.  Questions were encouraged and answered by Dr. Delilah Shan myself

## 2022-06-21 ENCOUNTER — Other Ambulatory Visit (HOSPITAL_COMMUNITY): Payer: Self-pay

## 2022-06-23 ENCOUNTER — Other Ambulatory Visit (HOSPITAL_COMMUNITY): Payer: Self-pay

## 2022-06-23 ENCOUNTER — Other Ambulatory Visit: Payer: Self-pay

## 2022-06-23 DIAGNOSIS — B029 Zoster without complications: Secondary | ICD-10-CM | POA: Diagnosis not present

## 2022-06-23 DIAGNOSIS — R21 Rash and other nonspecific skin eruption: Secondary | ICD-10-CM | POA: Diagnosis not present

## 2022-07-03 ENCOUNTER — Other Ambulatory Visit: Payer: Self-pay

## 2022-07-06 ENCOUNTER — Other Ambulatory Visit (HOSPITAL_COMMUNITY): Payer: Self-pay

## 2022-07-07 DIAGNOSIS — M0600 Rheumatoid arthritis without rheumatoid factor, unspecified site: Secondary | ICD-10-CM | POA: Diagnosis not present

## 2022-07-14 ENCOUNTER — Ambulatory Visit (INDEPENDENT_AMBULATORY_CARE_PROVIDER_SITE_OTHER): Payer: Commercial Managed Care - PPO | Admitting: Orthopaedic Surgery

## 2022-07-14 ENCOUNTER — Encounter: Payer: Self-pay | Admitting: Orthopaedic Surgery

## 2022-07-14 ENCOUNTER — Ambulatory Visit (INDEPENDENT_AMBULATORY_CARE_PROVIDER_SITE_OTHER): Payer: 59

## 2022-07-14 DIAGNOSIS — M25571 Pain in right ankle and joints of right foot: Secondary | ICD-10-CM

## 2022-07-14 NOTE — Progress Notes (Signed)
HPI :Alice Klein returns today nearly 8 weeks status post right ankle lateral malleolus fracture.  She states she still has some swelling in the ankle.  She stopped wearing the ASO brace week ago due to the fact that it was causing more pain than helping.  She states she is overall doing well.  She denies any mechanical symptoms of the ankle.  Physical exam: Right ankle full dorsiflexion plantarflexion.  Able to eat actively invert and evert the foot without significant pain.  Out of 5 strength with inversion eversion against resistance.  Minimal tenderness over the lateral malleolus.  No gross deformity of the ankle.  Right calf supple nontender.  Radiographs: Right ankle 3 views show the talus to be well located within the ankle mortise.  No diastases.  Lateral malleolus fracture appears well-healed.  No other acute fractures bony abnormalities noted.  Impression: Right ankle lateral malleolus fracture  Plan: Offered formal therapy to work on strengthening also work on proprioception she defers.  Therefore she will work on the [employer] at [employer] to use ASO brace initially.  If she is to get no longer any long hikes or be on uneven ground recommend the ASO brace for at least the next 3 to 4 weeks.  Follow-up with [place] if she develops any mechanical symptoms or increasing pain.  Discussed with her she may have pain and swelling for up to 6 months to a year.  Questions were encouraged and answered at length.

## 2022-07-15 ENCOUNTER — Other Ambulatory Visit (HOSPITAL_COMMUNITY): Payer: Self-pay

## 2022-07-16 ENCOUNTER — Other Ambulatory Visit (HOSPITAL_COMMUNITY): Payer: Self-pay

## 2022-07-16 MED ORDER — ESCITALOPRAM OXALATE 10 MG PO TABS
10.0000 mg | ORAL_TABLET | Freq: Every evening | ORAL | 1 refills | Status: DC
  Filled 2022-07-16: qty 90, 90d supply, fill #0
  Filled 2022-10-14: qty 90, 90d supply, fill #1

## 2022-07-20 ENCOUNTER — Other Ambulatory Visit (HOSPITAL_COMMUNITY): Payer: Self-pay

## 2022-07-23 ENCOUNTER — Other Ambulatory Visit (HOSPITAL_COMMUNITY): Payer: Self-pay

## 2022-07-28 DIAGNOSIS — M0600 Rheumatoid arthritis without rheumatoid factor, unspecified site: Secondary | ICD-10-CM | POA: Diagnosis not present

## 2022-07-28 DIAGNOSIS — Z6824 Body mass index (BMI) 24.0-24.9, adult: Secondary | ICD-10-CM | POA: Diagnosis not present

## 2022-07-28 DIAGNOSIS — F4322 Adjustment disorder with anxiety: Secondary | ICD-10-CM | POA: Diagnosis not present

## 2022-07-28 DIAGNOSIS — N926 Irregular menstruation, unspecified: Secondary | ICD-10-CM | POA: Diagnosis not present

## 2022-07-29 ENCOUNTER — Other Ambulatory Visit: Payer: Self-pay

## 2022-07-29 ENCOUNTER — Other Ambulatory Visit (HOSPITAL_COMMUNITY): Payer: Self-pay

## 2022-07-29 MED ORDER — ALPRAZOLAM 0.5 MG PO TABS
0.5000 mg | ORAL_TABLET | Freq: Two times a day (BID) | ORAL | 2 refills | Status: DC
  Filled 2022-07-29: qty 60, 30d supply, fill #0
  Filled 2022-10-28: qty 60, 30d supply, fill #1

## 2022-08-02 ENCOUNTER — Other Ambulatory Visit (HOSPITAL_COMMUNITY): Payer: Self-pay

## 2022-08-02 ENCOUNTER — Other Ambulatory Visit: Payer: Self-pay

## 2022-08-11 DIAGNOSIS — L819 Disorder of pigmentation, unspecified: Secondary | ICD-10-CM | POA: Diagnosis not present

## 2022-08-11 DIAGNOSIS — D2261 Melanocytic nevi of right upper limb, including shoulder: Secondary | ICD-10-CM | POA: Diagnosis not present

## 2022-08-11 DIAGNOSIS — D2272 Melanocytic nevi of left lower limb, including hip: Secondary | ICD-10-CM | POA: Diagnosis not present

## 2022-08-11 DIAGNOSIS — D2262 Melanocytic nevi of left upper limb, including shoulder: Secondary | ICD-10-CM | POA: Diagnosis not present

## 2022-08-11 DIAGNOSIS — D224 Melanocytic nevi of scalp and neck: Secondary | ICD-10-CM | POA: Diagnosis not present

## 2022-08-11 DIAGNOSIS — D2271 Melanocytic nevi of right lower limb, including hip: Secondary | ICD-10-CM | POA: Diagnosis not present

## 2022-08-11 DIAGNOSIS — L570 Actinic keratosis: Secondary | ICD-10-CM | POA: Diagnosis not present

## 2022-08-11 DIAGNOSIS — L821 Other seborrheic keratosis: Secondary | ICD-10-CM | POA: Diagnosis not present

## 2022-08-11 DIAGNOSIS — D225 Melanocytic nevi of trunk: Secondary | ICD-10-CM | POA: Diagnosis not present

## 2022-08-11 DIAGNOSIS — D1801 Hemangioma of skin and subcutaneous tissue: Secondary | ICD-10-CM | POA: Diagnosis not present

## 2022-08-12 ENCOUNTER — Other Ambulatory Visit (HOSPITAL_COMMUNITY): Payer: Self-pay

## 2022-08-13 ENCOUNTER — Other Ambulatory Visit: Payer: Self-pay

## 2022-08-13 ENCOUNTER — Other Ambulatory Visit (HOSPITAL_COMMUNITY): Payer: Self-pay

## 2022-08-13 MED ORDER — MONTELUKAST SODIUM 10 MG PO TABS
10.0000 mg | ORAL_TABLET | Freq: Every day | ORAL | 1 refills | Status: DC
  Filled 2022-08-13 – 2022-11-06 (×5): qty 90, 90d supply, fill #0
  Filled 2023-02-08: qty 90, 90d supply, fill #1

## 2022-08-13 MED ORDER — LEFLUNOMIDE 20 MG PO TABS
20.0000 mg | ORAL_TABLET | Freq: Every day | ORAL | 3 refills | Status: DC
  Filled 2022-08-13: qty 30, 30d supply, fill #0
  Filled 2022-08-24 – 2022-09-14 (×2): qty 30, 30d supply, fill #1
  Filled 2022-10-14: qty 30, 30d supply, fill #2
  Filled 2022-11-06: qty 30, 30d supply, fill #3

## 2022-08-24 ENCOUNTER — Other Ambulatory Visit (HOSPITAL_COMMUNITY): Payer: Self-pay

## 2022-08-24 ENCOUNTER — Other Ambulatory Visit: Payer: Self-pay

## 2022-08-24 ENCOUNTER — Other Ambulatory Visit: Payer: Self-pay | Admitting: Internal Medicine

## 2022-08-25 ENCOUNTER — Other Ambulatory Visit: Payer: Self-pay | Admitting: Pharmacist

## 2022-08-25 ENCOUNTER — Other Ambulatory Visit: Payer: Self-pay

## 2022-08-25 ENCOUNTER — Other Ambulatory Visit (HOSPITAL_COMMUNITY): Payer: Self-pay

## 2022-08-25 MED ORDER — CIMZIA 2 X 200 MG/ML ~~LOC~~ PSKT: PREFILLED_SYRINGE | SUBCUTANEOUS | 5 refills | Status: DC

## 2022-08-25 MED ORDER — CERTOLIZUMAB PEGOL 200 MG/ML ~~LOC~~ PSKT
200.0000 mg | PREFILLED_SYRINGE | SUBCUTANEOUS | 5 refills | Status: DC
  Filled 2022-08-25: qty 1, 30d supply, fill #0
  Filled 2022-09-01 (×2): qty 1, 28d supply, fill #0
  Filled 2022-10-13: qty 1, 28d supply, fill #1
  Filled 2022-11-18: qty 1, 28d supply, fill #2
  Filled 2022-12-29: qty 1, 28d supply, fill #3
  Filled 2023-01-26: qty 1, 28d supply, fill #4
  Filled 2023-02-22: qty 1, 28d supply, fill #5
  Filled 2023-03-21: qty 1, 28d supply, fill #6
  Filled 2023-04-20: qty 1, 28d supply, fill #7
  Filled 2023-05-18: qty 1, 28d supply, fill #8
  Filled 2023-06-14: qty 1, 28d supply, fill #9
  Filled 2023-07-13: qty 1, 28d supply, fill #10
  Filled 2023-08-12: qty 1, 28d supply, fill #11

## 2022-08-26 ENCOUNTER — Other Ambulatory Visit (HOSPITAL_COMMUNITY): Payer: Self-pay

## 2022-08-27 ENCOUNTER — Other Ambulatory Visit (HOSPITAL_COMMUNITY): Payer: Self-pay

## 2022-08-27 DIAGNOSIS — Z79899 Other long term (current) drug therapy: Secondary | ICD-10-CM | POA: Diagnosis not present

## 2022-08-27 DIAGNOSIS — F4322 Adjustment disorder with anxiety: Secondary | ICD-10-CM | POA: Diagnosis not present

## 2022-08-27 DIAGNOSIS — Z6824 Body mass index (BMI) 24.0-24.9, adult: Secondary | ICD-10-CM | POA: Diagnosis not present

## 2022-08-27 DIAGNOSIS — Z780 Asymptomatic menopausal state: Secondary | ICD-10-CM | POA: Diagnosis not present

## 2022-08-30 ENCOUNTER — Other Ambulatory Visit (HOSPITAL_COMMUNITY): Payer: Self-pay

## 2022-09-01 ENCOUNTER — Other Ambulatory Visit (HOSPITAL_COMMUNITY): Payer: Self-pay

## 2022-09-06 ENCOUNTER — Other Ambulatory Visit (HOSPITAL_COMMUNITY): Payer: Self-pay

## 2022-09-07 ENCOUNTER — Other Ambulatory Visit (HOSPITAL_COMMUNITY): Payer: Self-pay

## 2022-09-08 ENCOUNTER — Other Ambulatory Visit (HOSPITAL_COMMUNITY): Payer: Self-pay

## 2022-09-08 DIAGNOSIS — M1991 Primary osteoarthritis, unspecified site: Secondary | ICD-10-CM | POA: Diagnosis not present

## 2022-09-08 DIAGNOSIS — Z6824 Body mass index (BMI) 24.0-24.9, adult: Secondary | ICD-10-CM | POA: Diagnosis not present

## 2022-09-08 DIAGNOSIS — R5383 Other fatigue: Secondary | ICD-10-CM | POA: Diagnosis not present

## 2022-09-08 DIAGNOSIS — M0600 Rheumatoid arthritis without rheumatoid factor, unspecified site: Secondary | ICD-10-CM | POA: Diagnosis not present

## 2022-09-08 DIAGNOSIS — Z79899 Other long term (current) drug therapy: Secondary | ICD-10-CM | POA: Diagnosis not present

## 2022-09-09 ENCOUNTER — Other Ambulatory Visit: Payer: Self-pay

## 2022-09-09 ENCOUNTER — Encounter: Payer: Self-pay | Admitting: Radiology

## 2022-09-09 ENCOUNTER — Other Ambulatory Visit (HOSPITAL_COMMUNITY): Payer: Self-pay

## 2022-09-13 ENCOUNTER — Other Ambulatory Visit (HOSPITAL_COMMUNITY): Payer: Self-pay

## 2022-09-15 ENCOUNTER — Other Ambulatory Visit: Payer: Self-pay

## 2022-09-15 ENCOUNTER — Other Ambulatory Visit (HOSPITAL_COMMUNITY): Payer: Self-pay

## 2022-09-15 ENCOUNTER — Other Ambulatory Visit (HOSPITAL_BASED_OUTPATIENT_CLINIC_OR_DEPARTMENT_OTHER): Payer: Self-pay

## 2022-09-15 DIAGNOSIS — D649 Anemia, unspecified: Secondary | ICD-10-CM | POA: Diagnosis not present

## 2022-09-16 ENCOUNTER — Other Ambulatory Visit: Payer: Self-pay

## 2022-09-16 ENCOUNTER — Other Ambulatory Visit (HOSPITAL_COMMUNITY): Payer: Self-pay

## 2022-09-17 ENCOUNTER — Other Ambulatory Visit: Payer: Self-pay

## 2022-09-20 ENCOUNTER — Other Ambulatory Visit: Payer: Self-pay

## 2022-09-20 ENCOUNTER — Ambulatory Visit: Payer: 59 | Admitting: Orthopaedic Surgery

## 2022-09-20 DIAGNOSIS — G8929 Other chronic pain: Secondary | ICD-10-CM

## 2022-09-20 DIAGNOSIS — M25562 Pain in left knee: Secondary | ICD-10-CM

## 2022-09-20 MED ORDER — METHYLPREDNISOLONE ACETATE 40 MG/ML IJ SUSP
40.0000 mg | INTRAMUSCULAR | Status: AC | PRN
  Administered 2022-09-20: 40 mg via INTRA_ARTICULAR

## 2022-09-20 MED ORDER — LIDOCAINE HCL 1 % IJ SOLN
3.0000 mL | INTRAMUSCULAR | Status: AC | PRN
  Administered 2022-09-20: 3 mL

## 2022-09-20 NOTE — Progress Notes (Signed)
The patient is very well-known to me.  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  She also has dealt with a right ankle lateral malleolus fracture did not require surgery.  I have seen her for her left knee in May of last year.  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  I did provide a steroid injection in May of last year in that left knee.  She is traveling next week and is requesting a steroid injection today with her left knee.  She has been having worsening pain and intermittent swelling with the left knee as well.  On my exam she does have significant grinding at the patellofemoral joint with crepitation.  There is no effusion today.  Her knees hyperextend.  Per her request I did place a steroid injection in her left knee today which she tolerated well.  Also agree with trying this injection 1 more time.  Given the arthritic changes of the patellofemoral joint, I feel that hyaluronic acid is warranted to treat the pain of osteoarthritis that she is experiencing with that left knee.  We will see if we can get that ordered for her.  She is already been working out in the gym working on quad strengthening exercises and so it is not necessary to send her to physical therapy because she is doing all the correct exercises in her gym.      Procedure Note  Patient: Alice Klein             Date of Birth: [birth year]           MRN: [id number]             Visit Date: 09/20/2022  Procedures: Visit Diagnoses:  1. Chronic pain of left knee     Large Joint Inj: L knee on 09/20/2022 4:13 PM Indications: diagnostic evaluation and pain Details: 22 G 1.5 in needle, superolateral approach  Arthrogram: No  Medications: 3 mL lidocaine 1 %; 40 mg methylPREDNISolone acetate 40 MG/ML Outcome: tolerated well, no immediate complications Procedure, treatment alternatives, risks and benefits  explained, specific risks discussed. Consent was given by the patient. Immediately prior to procedure a time out was called to verify the correct patient, procedure, equipment, support staff and site/side marked as required. Patient was prepped and draped in the usual sterile fashion.

## 2022-09-21 ENCOUNTER — Telehealth: Payer: Self-pay

## 2022-09-21 ENCOUNTER — Other Ambulatory Visit (HOSPITAL_COMMUNITY): Payer: Self-pay

## 2022-09-21 NOTE — Telephone Encounter (Signed)
Left knee gel injection ?

## 2022-09-21 NOTE — Telephone Encounter (Signed)
VOB submitted for Monovisc, left knee.  

## 2022-09-22 ENCOUNTER — Other Ambulatory Visit (HOSPITAL_COMMUNITY): Payer: Self-pay

## 2022-09-22 ENCOUNTER — Other Ambulatory Visit: Payer: Self-pay

## 2022-09-23 ENCOUNTER — Other Ambulatory Visit (HOSPITAL_COMMUNITY): Payer: Self-pay

## 2022-09-29 ENCOUNTER — Other Ambulatory Visit (HOSPITAL_COMMUNITY): Payer: Self-pay

## 2022-10-13 ENCOUNTER — Other Ambulatory Visit: Payer: Self-pay

## 2022-10-13 ENCOUNTER — Other Ambulatory Visit (HOSPITAL_COMMUNITY): Payer: Self-pay

## 2022-10-14 ENCOUNTER — Other Ambulatory Visit: Payer: Self-pay

## 2022-10-14 ENCOUNTER — Other Ambulatory Visit (HOSPITAL_COMMUNITY): Payer: Self-pay

## 2022-10-18 ENCOUNTER — Other Ambulatory Visit: Payer: Self-pay

## 2022-10-26 ENCOUNTER — Other Ambulatory Visit (HOSPITAL_COMMUNITY): Payer: Self-pay

## 2022-10-28 ENCOUNTER — Other Ambulatory Visit (HOSPITAL_COMMUNITY): Payer: Self-pay

## 2022-10-28 ENCOUNTER — Encounter: Payer: Self-pay | Admitting: Orthopaedic Surgery

## 2022-11-05 ENCOUNTER — Other Ambulatory Visit: Payer: Self-pay

## 2022-11-05 DIAGNOSIS — G8929 Other chronic pain: Secondary | ICD-10-CM

## 2022-11-08 ENCOUNTER — Other Ambulatory Visit: Payer: Self-pay

## 2022-11-15 ENCOUNTER — Encounter: Payer: Self-pay | Admitting: Physician Assistant

## 2022-11-15 ENCOUNTER — Ambulatory Visit: Payer: 59 | Admitting: Physician Assistant

## 2022-11-15 DIAGNOSIS — G8929 Other chronic pain: Secondary | ICD-10-CM | POA: Diagnosis not present

## 2022-11-15 DIAGNOSIS — M25562 Pain in left knee: Secondary | ICD-10-CM | POA: Diagnosis not present

## 2022-11-15 MED ORDER — LIDOCAINE HCL 1 % IJ SOLN
3.0000 mL | INTRAMUSCULAR | Status: AC | PRN
Start: 2022-11-15 — End: 2022-11-15
  Administered 2022-11-15: 3 mL

## 2022-11-15 MED ORDER — HYALURONAN 88 MG/4ML IX SOSY
88.0000 mg | PREFILLED_SYRINGE | INTRA_ARTICULAR | Status: AC | PRN
Start: 2022-11-15 — End: 2022-11-15
  Administered 2022-11-15: 88 mg via INTRA_ARTICULAR

## 2022-11-15 NOTE — Progress Notes (Signed)
   Procedure Note  Patient: Alice Klein             Date of Birth: 12-07-1975           MRN: [id number]             Visit Date: 11/15/2022 HPI: Patient comes in today for scheduled left knee Monovisc injection.  She has no patellofemoral chondromalacia left knee.  She has had no new injury to the knee.  She has no scheduled surgery for the left knee in the next 6 months.  Physical exam: Left knee good range of motion no abnormal warmth erythema or effusion Procedures: Visit Diagnoses:  1. Chronic pain of left knee     Large Joint Inj: L knee on 11/15/2022 1:32 PM Indications: pain Details: 22 G 1.5 in needle, anterolateral approach  Arthrogram: No  Medications: 88 mg Hyaluronan 88 MG/4ML; 3 mL lidocaine 1 % Outcome: tolerated well, no immediate complications Procedure, treatment alternatives, risks and benefits explained, specific risks discussed. Consent was given by the patient. Immediately prior to procedure a time out was called to verify the correct patient, procedure, equipment, support staff and site/side marked as required. Patient was prepped and draped in the usual sterile fashion.     Plan: She will follow-up with [place] as needed.  Knows to wait 6 months between supplemental injections.

## 2022-11-18 ENCOUNTER — Other Ambulatory Visit (HOSPITAL_COMMUNITY): Payer: Self-pay

## 2022-12-03 ENCOUNTER — Other Ambulatory Visit (HOSPITAL_COMMUNITY): Payer: Self-pay

## 2022-12-08 ENCOUNTER — Other Ambulatory Visit: Payer: Self-pay

## 2022-12-09 DIAGNOSIS — Z6824 Body mass index (BMI) 24.0-24.9, adult: Secondary | ICD-10-CM | POA: Diagnosis not present

## 2022-12-09 DIAGNOSIS — N920 Excessive and frequent menstruation with regular cycle: Secondary | ICD-10-CM | POA: Diagnosis not present

## 2022-12-09 DIAGNOSIS — Z1331 Encounter for screening for depression: Secondary | ICD-10-CM | POA: Diagnosis not present

## 2022-12-09 DIAGNOSIS — Z01419 Encounter for gynecological examination (general) (routine) without abnormal findings: Secondary | ICD-10-CM | POA: Diagnosis not present

## 2022-12-09 DIAGNOSIS — D509 Iron deficiency anemia, unspecified: Secondary | ICD-10-CM | POA: Diagnosis not present

## 2022-12-10 ENCOUNTER — Other Ambulatory Visit: Payer: Self-pay

## 2022-12-19 ENCOUNTER — Other Ambulatory Visit (HOSPITAL_COMMUNITY): Payer: Self-pay

## 2022-12-20 ENCOUNTER — Other Ambulatory Visit (HOSPITAL_COMMUNITY): Payer: Self-pay

## 2022-12-20 ENCOUNTER — Other Ambulatory Visit: Payer: Self-pay

## 2022-12-20 MED ORDER — LEFLUNOMIDE 20 MG PO TABS
20.0000 mg | ORAL_TABLET | Freq: Every day | ORAL | 3 refills | Status: DC
  Filled 2022-12-20: qty 30, 30d supply, fill #0
  Filled 2023-01-14: qty 30, 30d supply, fill #1
  Filled 2023-02-13: qty 30, 30d supply, fill #2
  Filled 2023-03-21: qty 30, 30d supply, fill #3

## 2022-12-29 ENCOUNTER — Other Ambulatory Visit (HOSPITAL_COMMUNITY): Payer: Self-pay

## 2022-12-30 DIAGNOSIS — M79671 Pain in right foot: Secondary | ICD-10-CM | POA: Diagnosis not present

## 2023-01-05 ENCOUNTER — Other Ambulatory Visit (HOSPITAL_COMMUNITY): Payer: Self-pay

## 2023-01-11 ENCOUNTER — Other Ambulatory Visit (HOSPITAL_COMMUNITY): Payer: Self-pay

## 2023-01-11 MED ORDER — CELECOXIB 200 MG PO CAPS
ORAL_CAPSULE | ORAL | 1 refills | Status: DC
  Filled 2023-01-11: qty 180, 90d supply, fill #0
  Filled 2023-06-19: qty 180, 90d supply, fill #1

## 2023-01-14 ENCOUNTER — Other Ambulatory Visit: Payer: Self-pay

## 2023-01-22 ENCOUNTER — Other Ambulatory Visit (HOSPITAL_COMMUNITY): Payer: Self-pay

## 2023-01-24 ENCOUNTER — Other Ambulatory Visit (HOSPITAL_COMMUNITY): Payer: Self-pay

## 2023-01-26 ENCOUNTER — Other Ambulatory Visit (HOSPITAL_COMMUNITY): Payer: Self-pay

## 2023-01-28 ENCOUNTER — Other Ambulatory Visit: Payer: Self-pay

## 2023-01-31 ENCOUNTER — Other Ambulatory Visit (HOSPITAL_COMMUNITY): Payer: Self-pay

## 2023-01-31 ENCOUNTER — Other Ambulatory Visit: Payer: Self-pay

## 2023-01-31 MED ORDER — ESCITALOPRAM OXALATE 10 MG PO TABS
10.0000 mg | ORAL_TABLET | Freq: Every day | ORAL | 1 refills | Status: DC
  Filled 2023-01-31: qty 90, 90d supply, fill #0
  Filled 2023-04-24: qty 90, 90d supply, fill #1

## 2023-02-08 ENCOUNTER — Other Ambulatory Visit: Payer: Self-pay

## 2023-02-11 ENCOUNTER — Other Ambulatory Visit (HOSPITAL_COMMUNITY): Payer: Self-pay

## 2023-02-13 ENCOUNTER — Other Ambulatory Visit (HOSPITAL_COMMUNITY): Payer: Self-pay

## 2023-02-22 ENCOUNTER — Other Ambulatory Visit (HOSPITAL_COMMUNITY): Payer: Self-pay

## 2023-02-23 ENCOUNTER — Encounter: Payer: Self-pay | Admitting: Obstetrics and Gynecology

## 2023-02-23 ENCOUNTER — Ambulatory Visit (INDEPENDENT_AMBULATORY_CARE_PROVIDER_SITE_OTHER): Payer: 59 | Admitting: Obstetrics and Gynecology

## 2023-02-23 VITALS — BP 123/71 | HR 74 | Ht 69.0 in | Wt 154.0 lb

## 2023-02-23 DIAGNOSIS — Z1339 Encounter for screening examination for other mental health and behavioral disorders: Secondary | ICD-10-CM

## 2023-02-23 DIAGNOSIS — Z7689 Persons encountering health services in other specified circumstances: Secondary | ICD-10-CM

## 2023-02-23 DIAGNOSIS — N939 Abnormal uterine and vaginal bleeding, unspecified: Secondary | ICD-10-CM

## 2023-02-23 NOTE — Progress Notes (Signed)
NEW GYNECOLOGY PATIENT Patient name: Alice Klein MRN [id number]  Date of birth: 1976-06-10 Chief Complaint:   No chief complaint on file.     History:  Alice Klein is a 47 y.o. [phone] being seen today for heavy menses. Had terrible menses when she was younger - painful and heavy, got better after oldest daughter. SAB with emergent d&C 2009. Youngest in 2020  and then periods were normal. Had mirena x2 and had one removed early due to cramping; put in 2nd one. Husband had a vasectomy and then prostate cancer diagnosis. 1.5 yrs ago cramping again and heavy periods and passing large clots in the last 6-8 months with flooding in 20-30 minutes. Had her hormones tested and told she was perimenoapuase, was at heaviest when it started . Since losing about 16lbs without much change. Was on birth control previously; had to be on lowest dose of pills; then started having arrythmia and HTN. Has had issues w/ other control; had periods with mirena.       Gynecologic History Patient's last menstrual period was 02/03/2023 (exact date). Contraception: {method:5051} Last Pap: ***. Result was {norm/abn:16337} with negative HPV Last Mammogram: ***.  Result was {norm/abn:16337} Last Colonoscopy: ***.  Result was {norm/abn:16337}  Obstetric History OB History  Gravida Para Term Preterm AB Living  4 2 1 1 2 2   SAB IAB Ectopic Multiple Live Births  2       2    # Outcome Date GA Lbr Len/2nd Weight Sex Type Anes PTL Lv  4 Preterm 2010 [redacted]w[redacted]d   F  None N LIV  3 SAB 2009          2 Term 2008 [redacted]w[redacted]d   F Vag-Spont None N LIV  1 SAB 2007            Past Medical History:  Diagnosis Date  . Allergies   . Anxiety   . Chronic kidney disease    uritral re-implantation  . GERD (gastroesophageal reflux disease)   . Headache   . Palpitations 08/23/2019  . Pneumonia   . Reactive airway disease   . Seronegative rheumatoid arthritis (HCC)   . SVT (supraventricular tachycardia)   . UTI (urinary  tract infection)     Past Surgical History:  Procedure Laterality Date  . DILATION AND CURETTAGE OF UTERUS  2009  . ESOPHAGOGASTRODUODENOSCOPY  10/29/2013   Normal EGD  . HIP ARTHROPLASTY Left   . INTRAUTERINE DEVICE (IUD) INSERTION  02/2009  . OTHER SURGICAL HISTORY     Ureteral Surgery   . SHOULDER ARTHROSCOPY Right   . TONSILLECTOMY    . TOTAL HIP ARTHROPLASTY Left 07/11/2020   Procedure: LEFT TOTAL HIP ARTHROPLASTY ANTERIOR APPROACH;  Surgeon: Kathryne Hitch, MD;  Location: [facility];  Service: Orthopedics;  Laterality: Left;    Current Outpatient Medications on File Prior to Visit  Medication Sig Dispense Refill  . albuterol (VENTOLIN HFA) 108 (90 Base) MCG/ACT inhaler Inhale 2 puffs into the lungs 30 minutes prior to exposure to allergen. 18 g 3  . ALPRAZolam (XANAX) 0.5 MG tablet Take 1 tablet (0.5 mg total) by mouth 2 (two) times daily. 60 tablet 2  . Ascorbic Acid (VITAMIN C PO) Take 180 mg by mouth daily.    Marland Kitchen CALCIUM-VITAMIN D PO Take 1 tablet by mouth daily.    . celecoxib (CELEBREX) 200 MG capsule Take 1 capsule (200 mg) by mouth 2 times daily with a meal. 180 capsule 1  . certolizumab  pegol (CIMZIA) 200 MG/ML prefilled syringe Inject 200 mg into the skin every 14 (fourteen) days. 2 each 5  . Cyanocobalamin (B-12 PO) Take 180 mcg by mouth daily.    Marland Kitchen escitalopram (LEXAPRO) 10 MG tablet Take 1 tablet (10 mg total) by mouth 1 hour prior to bedtime. 90 tablet 1  . Ferrous Sulfate (IRON PO) Take 2.67 mg by mouth daily.    . Folic Acid (FOLATE PO) Take 400 mcg by mouth daily.    . IODINE, KELP, PO Take 150 mcg by mouth daily.    Marland Kitchen leflunomide (ARAVA) 20 MG tablet Take 1 tablet (20 mg total) by mouth daily. 30 tablet 3  . Levocetirizine Dihydrochloride (XYZAL PO) Take by mouth.    . montelukast (SINGULAIR) 10 MG tablet Take 1 tablet (10 mg total) by mouth at bedtime. 90 tablet 1  . Probiotic Product (PROBIOTIC DAILY PO) Take 1 capsule by mouth daily.    . Pyridoxine HCl  (B-6 PO) Take 8.5 mg by mouth daily.    . Thiamine HCl (THIAMINE PO) Take 4.8 mg by mouth daily.    Marland Kitchen VITAMIN A PO Take 450 mcg by mouth daily.    Marland Kitchen VITAMIN D PO Take 40 mcg by mouth daily.    Marland Kitchen BIOTIN PO Take 240 mcg by mouth daily. (Patient not taking: Reported on 02/23/2023)    . escitalopram (LEXAPRO) 10 MG tablet Take 1 tablet (10 mg total) by mouth 1 hour before bedtime. (Patient not taking: Reported on 02/23/2023) 90 tablet 1   No current facility-administered medications on file prior to visit.    Allergies  Allergen Reactions  . Clarithromycin Shortness Of Breath  . Penicillin G Other (See Comments)    unknown  . Latex Rash    hives  . Meloxicam     Dizziness    Social History:  reports that she quit smoking about 20 years ago. Her smoking use included cigarettes. She has never used smokeless tobacco. She reports current alcohol use of about 3.0 standard drinks of alcohol per week. She reports that she does not use drugs.  Family History  Problem Relation Age of Onset  . Colon polyps Mother   . Hypertension Mother   . Hypertension Father   . Stroke Brother   . Colon cancer Neg Hx     The following portions of the patient's history were reviewed and updated as appropriate: allergies, current medications, past family history, past medical history, past social history, past surgical history and problem list.  Review of Systems Pertinent items noted in HPI and remainder of comprehensive ROS otherwise negative.  Physical Exam:  BP 123/71   Pulse 74   Ht 5\' 9"  (1.753 m)   Wt 154 lb (69.9 kg)   LMP 02/03/2023 (Exact Date)   BMI 22.74 kg/m  Physical Exam     Assessment and Plan:   There are no diagnoses linked to this encounter.   Routine preventative health maintenance measures emphasized. Please refer to After Visit Summary for other counseling recommendations.   Follow-up: No follow-ups on file.      Lorriane Shire, MD Obstetrician & Gynecologist,  Faculty Practice [facility] [employer], [facility]

## 2023-02-28 ENCOUNTER — Other Ambulatory Visit (HOSPITAL_COMMUNITY): Payer: Self-pay

## 2023-03-01 ENCOUNTER — Other Ambulatory Visit: Payer: Self-pay

## 2023-03-14 ENCOUNTER — Other Ambulatory Visit (HOSPITAL_COMMUNITY): Payer: Self-pay

## 2023-03-14 DIAGNOSIS — Z6822 Body mass index (BMI) 22.0-22.9, adult: Secondary | ICD-10-CM | POA: Diagnosis not present

## 2023-03-14 DIAGNOSIS — M1991 Primary osteoarthritis, unspecified site: Secondary | ICD-10-CM | POA: Diagnosis not present

## 2023-03-14 DIAGNOSIS — M0600 Rheumatoid arthritis without rheumatoid factor, unspecified site: Secondary | ICD-10-CM | POA: Diagnosis not present

## 2023-03-14 DIAGNOSIS — Z79899 Other long term (current) drug therapy: Secondary | ICD-10-CM | POA: Diagnosis not present

## 2023-03-14 MED ORDER — DICLOFENAC SODIUM 75 MG PO TBEC
75.0000 mg | DELAYED_RELEASE_TABLET | Freq: Two times a day (BID) | ORAL | 3 refills | Status: DC | PRN
  Filled 2023-03-14: qty 60, 30d supply, fill #0

## 2023-03-15 ENCOUNTER — Other Ambulatory Visit: Payer: Self-pay

## 2023-03-16 ENCOUNTER — Ambulatory Visit (HOSPITAL_BASED_OUTPATIENT_CLINIC_OR_DEPARTMENT_OTHER)
Admission: RE | Admit: 2023-03-16 | Discharge: 2023-03-16 | Disposition: A | Discharge status: Home / Self Care | Payer: 59 | Source: Ambulatory Visit | Attending: Obstetrics and Gynecology | Admitting: Obstetrics and Gynecology

## 2023-03-16 DIAGNOSIS — N939 Abnormal uterine and vaginal bleeding, unspecified: Secondary | ICD-10-CM | POA: Insufficient documentation

## 2023-03-21 ENCOUNTER — Other Ambulatory Visit (HOSPITAL_COMMUNITY): Payer: Self-pay

## 2023-03-21 ENCOUNTER — Other Ambulatory Visit: Payer: Self-pay

## 2023-03-25 ENCOUNTER — Telehealth: Payer: Self-pay

## 2023-03-25 NOTE — Telephone Encounter (Signed)
-----   Message from Lorriane Shire sent at 03/24/2023  5:48 PM EDT ----- Follow up for emb

## 2023-03-25 NOTE — Telephone Encounter (Signed)
Patient returning phone call regarding results. Per recommendation, patient needs an endometrial biopsy. Offered patient scheduling for emb, patient declines scheduling emb at this time and states 'I will call back in a few months to schedule."

## 2023-03-26 ENCOUNTER — Encounter (HOSPITAL_COMMUNITY): Payer: Self-pay

## 2023-04-20 ENCOUNTER — Other Ambulatory Visit: Payer: Self-pay

## 2023-04-20 NOTE — Progress Notes (Signed)
Specialty Pharmacy Refill Coordination Note  Alice Klein is a 47 y.o. female contacted today regarding refills of specialty medication(s) Certolizumab Pegol   Patient requested Delivery   Delivery date: 04/27/23   Verified address: Patient address [address]  [place] [address]   Medication will be filled on 04/26/23.

## 2023-04-24 ENCOUNTER — Other Ambulatory Visit (HOSPITAL_COMMUNITY): Payer: Self-pay

## 2023-04-25 ENCOUNTER — Other Ambulatory Visit (HOSPITAL_COMMUNITY): Payer: Self-pay

## 2023-04-25 ENCOUNTER — Other Ambulatory Visit: Payer: Self-pay

## 2023-04-25 MED ORDER — LEFLUNOMIDE 20 MG PO TABS
20.0000 mg | ORAL_TABLET | Freq: Every day | ORAL | 5 refills | Status: DC
  Filled 2023-04-25: qty 30, 30d supply, fill #0
  Filled 2023-05-20: qty 30, 30d supply, fill #1
  Filled 2023-06-19: qty 30, 30d supply, fill #2
  Filled 2023-07-13: qty 30, 30d supply, fill #3

## 2023-04-28 ENCOUNTER — Telehealth: Payer: Self-pay | Admitting: Pharmacist

## 2023-04-28 NOTE — Telephone Encounter (Signed)
Called patient to schedule an appointment for the [employer] [facility]. I was unable to reach the patient so I left a HIPAA-compliant message requesting that the patient return my call.   Luke Van Ausdall, PharmD, BCACP, CPP Clinical Pharmacist [facility] [phone]  

## 2023-05-04 ENCOUNTER — Other Ambulatory Visit: Payer: Self-pay

## 2023-05-04 ENCOUNTER — Other Ambulatory Visit (HOSPITAL_COMMUNITY): Payer: Self-pay

## 2023-05-04 MED ORDER — MONTELUKAST SODIUM 10 MG PO TABS
10.0000 mg | ORAL_TABLET | Freq: Every day | ORAL | 1 refills | Status: DC
  Filled 2023-05-04 (×2): qty 90, 90d supply, fill #0
  Filled 2023-08-07: qty 90, 90d supply, fill #1

## 2023-05-04 MED ORDER — ESCITALOPRAM OXALATE 10 MG PO TABS
10.0000 mg | ORAL_TABLET | Freq: Every day | ORAL | 1 refills | Status: DC
  Filled 2023-05-04 – 2023-07-25 (×2): qty 90, 90d supply, fill #0

## 2023-05-05 DIAGNOSIS — D485 Neoplasm of uncertain behavior of skin: Secondary | ICD-10-CM | POA: Diagnosis not present

## 2023-05-05 DIAGNOSIS — L720 Epidermal cyst: Secondary | ICD-10-CM | POA: Diagnosis not present

## 2023-05-18 ENCOUNTER — Other Ambulatory Visit: Payer: Self-pay

## 2023-05-18 ENCOUNTER — Encounter (HOSPITAL_COMMUNITY): Payer: Self-pay

## 2023-05-18 NOTE — Progress Notes (Signed)
Specialty Pharmacy Refill Coordination Note  Logann Trinh is a 47 y.o. female contacted today regarding refills of specialty medication(s) Certolizumab Pegol   Patient requested Delivery   Delivery date: 05/25/23   Verified address: Patient address [address]  [place] [address]   Medication will be filled on 05/24/23.

## 2023-05-18 NOTE — Progress Notes (Signed)
Specialty Pharmacy Ongoing Clinical Assessment Note  Alice Klein is a 47 y.o. female who is being followed by the specialty pharmacy service for RxSp Rheumatoid Arthritis   Patient's specialty medication(s) reviewed today: Certolizumab Pegol   Missed doses in the last 4 weeks: 0   Patient/Caregiver did not have any additional questions or concerns.   Therapeutic benefit summary: Patient is achieving benefit   Adverse events/side effects summary: No adverse events/side effects   Patient's therapy is appropriate to: Continue    Goals Addressed             This Visit's Progress    Reduce signs and symptoms       Patient is on track. Patient will maintain adherence         Follow up:  6 months  Otto Herb Specialty Pharmacist

## 2023-05-21 ENCOUNTER — Other Ambulatory Visit (HOSPITAL_COMMUNITY): Payer: Self-pay

## 2023-05-24 ENCOUNTER — Other Ambulatory Visit: Payer: Self-pay

## 2023-06-10 ENCOUNTER — Other Ambulatory Visit: Payer: Self-pay

## 2023-06-10 ENCOUNTER — Other Ambulatory Visit (HOSPITAL_COMMUNITY): Payer: Self-pay

## 2023-06-10 DIAGNOSIS — F4322 Adjustment disorder with anxiety: Secondary | ICD-10-CM | POA: Diagnosis not present

## 2023-06-10 DIAGNOSIS — Z6822 Body mass index (BMI) 22.0-22.9, adult: Secondary | ICD-10-CM | POA: Diagnosis not present

## 2023-06-10 DIAGNOSIS — J302 Other seasonal allergic rhinitis: Secondary | ICD-10-CM | POA: Diagnosis not present

## 2023-06-10 DIAGNOSIS — M069 Rheumatoid arthritis, unspecified: Secondary | ICD-10-CM | POA: Diagnosis not present

## 2023-06-10 MED ORDER — FLUTICASONE PROPIONATE 50 MCG/ACT NA SUSP
2.0000 | Freq: Every day | NASAL | 3 refills | Status: AC
  Filled 2023-06-10: qty 48, 90d supply, fill #0

## 2023-06-14 ENCOUNTER — Other Ambulatory Visit: Payer: Self-pay

## 2023-06-14 ENCOUNTER — Other Ambulatory Visit (HOSPITAL_COMMUNITY): Payer: Self-pay

## 2023-06-14 NOTE — Progress Notes (Signed)
Specialty Pharmacy Refill Coordination Note  Alice Klein is a 47 y.o. female contacted today regarding refills of specialty medication(s) Certolizumab Pegol   Patient requested Delivery   Delivery date: 06/17/23   Verified address: [address]   [place] [address]   Medication will be filled on 06/16/23.

## 2023-06-16 ENCOUNTER — Other Ambulatory Visit: Payer: Self-pay

## 2023-06-20 ENCOUNTER — Other Ambulatory Visit (INDEPENDENT_AMBULATORY_CARE_PROVIDER_SITE_OTHER): Payer: Self-pay

## 2023-06-20 ENCOUNTER — Encounter: Payer: Self-pay | Admitting: Physician Assistant

## 2023-06-20 ENCOUNTER — Ambulatory Visit: Payer: 59 | Admitting: Physician Assistant

## 2023-06-20 ENCOUNTER — Other Ambulatory Visit: Payer: Self-pay

## 2023-06-20 VITALS — Ht 69.0 in | Wt 155.6 lb

## 2023-06-20 DIAGNOSIS — M25551 Pain in right hip: Secondary | ICD-10-CM | POA: Diagnosis not present

## 2023-06-20 DIAGNOSIS — M0600 Rheumatoid arthritis without rheumatoid factor, unspecified site: Secondary | ICD-10-CM | POA: Diagnosis not present

## 2023-06-20 MED ORDER — LIDOCAINE HCL 1 % IJ SOLN
3.0000 mL | INTRAMUSCULAR | Status: AC | PRN
  Administered 2023-06-20: 3 mL

## 2023-06-20 MED ORDER — METHYLPREDNISOLONE ACETATE 40 MG/ML IJ SUSP
40.0000 mg | INTRAMUSCULAR | Status: AC | PRN
  Administered 2023-06-20: 40 mg via INTRA_ARTICULAR

## 2023-06-20 NOTE — Progress Notes (Signed)
Office Visit Note   Patient: Alice Klein           Date of Birth: [birth year]           MRN: [id number] Visit Date: 06/20/2023              Requested by: Buckner Malta, MD [address] [place],  [place] [address] PCP: Buckner Malta, MD   Assessment & Plan: Visit Diagnoses:  1. Pain in right hip     Plan: She will work on IT band stretching.  If her pain persist or becomes worse would consider MRI to evaluate her right hip cartilage.  Questions were encouraged and answered at length.  Follow-Up Instructions: No follow-ups on file.   Orders:  Orders Placed This Encounter  Procedures   Large Joint Inj   XR HIP UNILAT W OR W/O PELVIS 2-3 VIEWS RIGHT   No orders of the defined types were placed in this encounter.     Procedures: Large Joint Inj: R greater trochanter on 06/20/2023 10:52 AM Indications: pain Details: 22 G 1.5 in needle, lateral approach  Arthrogram: No  Medications: 3 mL lidocaine 1 %; 40 mg methylPREDNISolone acetate 40 MG/ML Outcome: tolerated well, no immediate complications Procedure, treatment alternatives, risks and benefits explained, specific risks discussed. Consent was given by the patient. Immediately prior to procedure a time out was called to verify the correct patient, procedure, equipment, support staff and site/side marked as required. Patient was prepped and draped in the usual sterile fashion.       Clinical Data: No additional findings.   Subjective: Chief Complaint  Patient presents with   Right Hip - Pain    HPI Alice Klein returns today with new complaint of right hip pain.  She states that her hip pain started about 5 to 6 weeks ago.  Lateral aspect of the hip mainly.  Pain is worse with sitting and also with inclines.  She has been working out and was feeling good but developed this hip pain and has had to stop working out.  She denies any radicular symptoms down the leg.  States that her left total hip  arthroplasty which was performed 07/11/2020 was doing well. Review of Systems Negative for fevers chills.  Objective: Vital Signs: Ht 5\' 9"  (1.753 m)   Wt 155 lb 9.6 oz (70.6 kg)   BMI 22.98 kg/m   Physical Exam Constitutional:      Appearance: She is normal weight. She is not ill-appearing or diaphoretic.  Pulmonary:     Effort: Pulmonary effort is normal.  Neurological:     Mental Status: She is alert.  Psychiatric:        Mood and Affect: Mood normal.     Ortho Exam Bilateral hips good range of motion of both hips.  External rotation of the right hip causes discomfort.  She is tender over the right hip trochanteric region. Specialty Comments:  No specialty comments available.  Imaging: XR HIP UNILAT W OR W/O PELVIS 2-3 VIEWS RIGHT Result Date: 06/20/2023 AP pelvis lateral view the right hip: Bilateral hips well located.  No acute fractures acute findings.  Status post left total hip arthroplasty well-seated components.  The right hip joint is well-preserved.  No significant arthropathy.    PMFS History: Patient Active Problem List   Diagnosis Date Noted   Closed right ankle fracture 05/14/2022   Status post hip replacement 07/11/2020   Unilateral primary osteoarthritis, left hip 07/10/2020   Palpitations  08/23/2019   Past Medical History:  Diagnosis Date   Allergies    Anxiety    Chronic kidney disease    uritral re-implantation   GERD (gastroesophageal reflux disease)    Headache    Palpitations 08/23/2019   Pneumonia    Reactive airway disease    Seronegative rheumatoid arthritis (HCC)    SVT (supraventricular tachycardia)    UTI (urinary tract infection)     Family History  Problem Relation Age of Onset   Colon polyps Mother    Hypertension Mother    Hypertension Father    Stroke Brother    Colon cancer Neg Hx     Past Surgical History:  Procedure Laterality Date   DILATION AND CURETTAGE OF UTERUS  2009   ESOPHAGOGASTRODUODENOSCOPY  10/29/2013    Normal EGD   HIP ARTHROPLASTY Left    INTRAUTERINE DEVICE (IUD) INSERTION  02/2009   OTHER SURGICAL HISTORY     Ureteral Surgery    SHOULDER ARTHROSCOPY Right    TONSILLECTOMY     TOTAL HIP ARTHROPLASTY Left 07/11/2020   Procedure: LEFT TOTAL HIP ARTHROPLASTY ANTERIOR APPROACH;  Surgeon: Kathryne Hitch, MD;  Location: [facility];  Service: Orthopedics;  Laterality: Left;   Social History   Occupational History   Not on file  Tobacco Use   Smoking status: Former    Current packs/day: 0.00    Types: Cigarettes    Quit date: 2004    Years since quitting: 20.9   Smokeless tobacco: Never  Vaping Use   Vaping status: Never Used  Substance and Sexual Activity   Alcohol use: Yes    Alcohol/week: 3.0 standard drinks of alcohol    Types: 3 Glasses of wine per week    Comment: weekly   Drug use: Never   Sexual activity: Yes

## 2023-06-23 ENCOUNTER — Other Ambulatory Visit (HOSPITAL_COMMUNITY): Payer: Self-pay

## 2023-06-23 MED ORDER — MONTELUKAST SODIUM 10 MG PO TABS
10.0000 mg | ORAL_TABLET | Freq: Every day | ORAL | 1 refills | Status: DC
  Filled 2023-06-23 – 2023-11-01 (×2): qty 90, 90d supply, fill #0
  Filled 2024-02-12: qty 90, 90d supply, fill #1

## 2023-06-23 MED ORDER — ESCITALOPRAM OXALATE 10 MG PO TABS
10.0000 mg | ORAL_TABLET | Freq: Every day | ORAL | 1 refills | Status: AC
  Filled 2023-06-23: qty 90, 90d supply, fill #0

## 2023-07-12 ENCOUNTER — Other Ambulatory Visit: Payer: Self-pay

## 2023-07-13 ENCOUNTER — Other Ambulatory Visit: Payer: Self-pay | Admitting: Radiology

## 2023-07-13 ENCOUNTER — Other Ambulatory Visit: Payer: Self-pay

## 2023-07-13 DIAGNOSIS — M25551 Pain in right hip: Secondary | ICD-10-CM

## 2023-07-13 NOTE — Progress Notes (Signed)
 Specialty Pharmacy Refill Coordination Note  Alice Klein is a 48 y.o. female contacted today regarding refills of specialty medication(s) Certolizumab Pegol  (CIMZIA )   Patient requested Delivery   Delivery date: 07/21/23   Verified address: [address]   [place] [address]   Medication will be filled on 07/20/23.

## 2023-07-20 ENCOUNTER — Other Ambulatory Visit (HOSPITAL_COMMUNITY): Payer: Self-pay

## 2023-07-20 ENCOUNTER — Other Ambulatory Visit: Payer: Self-pay

## 2023-07-25 ENCOUNTER — Other Ambulatory Visit (HOSPITAL_COMMUNITY): Payer: Self-pay

## 2023-08-01 ENCOUNTER — Ambulatory Visit
Admission: RE | Admit: 2023-08-01 | Discharge: 2023-08-01 | Disposition: A | Discharge status: Home / Self Care | Payer: Commercial Managed Care - PPO | Source: Ambulatory Visit | Attending: Physician Assistant | Admitting: Physician Assistant

## 2023-08-01 DIAGNOSIS — M25551 Pain in right hip: Secondary | ICD-10-CM | POA: Diagnosis not present

## 2023-08-01 DIAGNOSIS — Z96641 Presence of right artificial hip joint: Secondary | ICD-10-CM | POA: Diagnosis not present

## 2023-08-08 ENCOUNTER — Other Ambulatory Visit: Payer: Self-pay

## 2023-08-09 NOTE — Telephone Encounter (Signed)
 done

## 2023-08-12 ENCOUNTER — Other Ambulatory Visit (HOSPITAL_COMMUNITY): Payer: Self-pay | Admitting: Pharmacy Technician

## 2023-08-12 ENCOUNTER — Other Ambulatory Visit (HOSPITAL_COMMUNITY): Payer: Self-pay

## 2023-08-12 NOTE — Progress Notes (Signed)
 Specialty Pharmacy Refill Coordination Note  Alice Klein is a 48 y.o. female contacted today regarding refills of specialty medication(s) Certolizumab Pegol  (CIMZIA )   Patient requested Delivery   Delivery date: 08/17/23   Verified address: Patient address [address]  [place]   Medication will be filled on 08/16/23.

## 2023-08-16 ENCOUNTER — Other Ambulatory Visit: Payer: Self-pay

## 2023-08-22 ENCOUNTER — Ambulatory Visit: Payer: Commercial Managed Care - PPO | Admitting: Orthopaedic Surgery

## 2023-08-22 ENCOUNTER — Encounter: Payer: Self-pay | Admitting: Orthopaedic Surgery

## 2023-08-22 DIAGNOSIS — M25551 Pain in right hip: Secondary | ICD-10-CM | POA: Diagnosis not present

## 2023-08-22 NOTE — Progress Notes (Signed)
The patient is an active 48 year old female well-known to [place].  We have replaced her left hip.  She has been dealing with right hip pain for the last few months.  She did increase her exercise routine and is very active.  However she developed lateral pain around her right hip and she points to the tip of the trochanteric area as a source of her pain.  A steroid injection was placed in this area back in December and it is not help significantly.  She had rested from her exercise routine and is try to really get back into big-time exercises but this is still prohibiting her from doing that.  She comes in today to go over MRI of her right hip which we felt was reasonable considering she has a history of a replacement on the left side.  On my exam today her right hip moves smoothly and fluidly.  Her pain is over the tip of the trochanteric area.  I did review the MRI of her right hip as well as looked at what the radiologist saw.  There is no acute findings around her right hip.  The cartilage is well-maintained and there is no fluid collections around the trochanteric area and no evidence of muscle tearing.  I would like to send her to my partner Dr. Shon Baton for his evaluation of the soft tissue of her right hip and considering soft treatment modalities to treat the pain that she is having in this area.  By the time he sees her on the fine with him considering a repeat steroid injection since she is not a diabetic.  I also did give her a prescription for outpatient physical therapy with [facility] in [place] for any modalities they could help decrease her right hip pain as well as assessing her workout routine in general.  She agrees with this treatment plan.

## 2023-09-08 ENCOUNTER — Ambulatory Visit: Payer: Commercial Managed Care - PPO | Admitting: Sports Medicine

## 2023-09-08 ENCOUNTER — Encounter: Payer: Self-pay | Admitting: Sports Medicine

## 2023-09-08 ENCOUNTER — Other Ambulatory Visit: Payer: Self-pay

## 2023-09-08 DIAGNOSIS — M25551 Pain in right hip: Secondary | ICD-10-CM

## 2023-09-08 DIAGNOSIS — R29898 Other symptoms and signs involving the musculoskeletal system: Secondary | ICD-10-CM | POA: Diagnosis not present

## 2023-09-08 DIAGNOSIS — G8929 Other chronic pain: Secondary | ICD-10-CM

## 2023-09-08 NOTE — Progress Notes (Signed)
 Alice Klein - 48 y.o. female MRN [id number]  Date of birth: May 19, 1976  Office Visit Note: Visit Date: 09/08/2023 PCP: Buckner Malta, MD Referred by: Buckner Malta, MD  Subjective: Chief Complaint  Patient presents with   Right Hip - Pain   HPI: Alice Klein is a pleasant 49 y.o. female who presents today for chronic right lateral hip pain (in setting of prior L-hip THA).  Dr. Magnus Ivan replaced her left hip in January 2022.  Adahlia states she started having pain in the right hip following this.  The pain comes and goes.  It is more so on the lateral side of the hip that feels like a deep aching sensation.  Back in December she saw Dondra Spry and had a steroid injection, landmark based near the greater trochanteric which did not give her much relief.  She is hoping to get back into regular exercise as her hip pain and weakness is prohibiting this.  She feels like she is dragging the right leg.  Has taken arthritis and other inflammatory medications but is holding on this as she would like to give her system and stomach a break. No N/T down leg.  Pertinent ROS were reviewed with the patient and found to be negative unless otherwise specified above in HPI.   Assessment & Plan: Visit Diagnoses:  1. Chronic right hip pain   2. Weakness of right hip    Plan: Impression is chronic right hip pain which localizes to the lateral hip with hip abduction weakness.  MRI shows no high-grade tearing of the gluteal tendons although I do see some undersurface wear and tear.  We discussed all treatment options for the hip, she is interested in trialing extracorporeal shockwave therapy, she would like to be reevaluated next week with this treatment.  We discussed the importance of targeting and strengthening her gluteal musculature, she had been sent for PT referral but she prefers to do her exercises on a home regimen first.  We did print out a customized handout for the hip and my athletic  trainer, Isabelle Course did review these in the room with her today.  She will perform these once daily.  We will try at least 2 treatments of extracorporeal shockwave therapy and see what sort of cumulative benefit she has going forward.  We discussed the role for an ultrasound-guided injection, but this moreso be to help control her pain if she is unable to tolerate her rehab.  *Additional treatment considerations: Nitroglycerin patch protocol, PRP injection  Follow-up: Return in about 1 week (around 09/15/2023) for schedule 2 appts 1-week apart for right hip.   Meds & Orders: No orders of the defined types were placed in this encounter.  No orders of the defined types were placed in this encounter.    Procedures: No procedures performed      Clinical History: No specialty comments available.  She reports that she quit smoking about 21 years ago. Her smoking use included cigarettes. She has never used smokeless tobacco. No results for input(s): "HGBA1C", "LABURIC" in the last 8760 hours.  Objective:    Physical Exam  Gen: Well-appearing, in no acute distress; non-toxic CV: Well-perfused. Warm.  Resp: Breathing unlabored on room air; no wheezing. Psych: Fluid speech in conversation; appropriate affect; normal thought process  Ortho Exam - Right hip: + TTP over the greater trochanter and more so the posterior facet near the insertion of the gluteal tendons.  There is 3-4/5 weakness with resisted hip abduction on  the right compared to full strength on the contralateral hip.  The right hip moves fluidly with internal and external logroll.  There is very mild pain with endrange FADIR testing, negative Stinchfield.  Imaging:  *Independent review of right hip MRI, on my read I agree with the below however I do feel there is some tendinopathy of the insertional gluteus medius specifically at the undersurface of the crossover of the gluteus medius and minimus.  There is no full-thickness tearing or  tendon retraction however.  MR Hip Right w/o contrast CLINICAL DATA:  Right hip pain over the last 3 months  EXAM: MR OF THE RIGHT HIP WITHOUT CONTRAST  TECHNIQUE: Multiplanar, multisequence MR imaging was performed. No intravenous contrast was administered.  COMPARISON:  Radiographs 06/20/2023  FINDINGS: Bones: Contralateral (left) total hip prosthesis. This introduces field heterogeneity into the fat saturated images.  No definite marrow edema in the right hip or acetabulum. Minimal spurring of the right femoral head. Normal right femoral head morphology.  Articular cartilage and labrum  Articular cartilage:  Unremarkable  Labrum:  Grossly unremarkable  Joint or bursal effusion  Joint effusion:  Absent  Bursae: No regional bursitis  Muscles and tendons  Muscles and tendons:  Unremarkable  Other findings  Miscellaneous:   Small uterine fibroids.  IMPRESSION: 1. Minimal spurring of the right femoral head. No acute findings. 2. Contralateral (left) total hip prosthesis. 3. Small uterine fibroids.  Electronically Signed   By: Gaylyn Rong M.D.   On: 08/08/2023 11:25  Hip XR 06/20/23: AP pelvis lateral view the right hip: Bilateral hips well located.  No  acute fractures acute findings.  Status post left total hip arthroplasty  well-seated components.  The right hip joint is well-preserved.  No  significant arthropathy.   Past Medical/Family/Surgical/Social History: Medications & Allergies reviewed per EMR, new medications updated. Patient Active Problem List   Diagnosis Date Noted   Closed right ankle fracture 05/14/2022   Status post hip replacement 07/11/2020   Unilateral primary osteoarthritis, left hip 07/10/2020   Palpitations 08/23/2019   Past Medical History:  Diagnosis Date   Allergies    Anxiety    Chronic kidney disease    uritral re-implantation   GERD (gastroesophageal reflux disease)    Headache    Palpitations 08/23/2019    Pneumonia    Reactive airway disease    Seronegative rheumatoid arthritis (HCC)    SVT (supraventricular tachycardia) (HCC)    UTI (urinary tract infection)    Family History  Problem Relation Age of Onset   Colon polyps Mother    Hypertension Mother    Hypertension Father    Stroke Brother    Colon cancer Neg Hx    Past Surgical History:  Procedure Laterality Date   DILATION AND CURETTAGE OF UTERUS  2009   ESOPHAGOGASTRODUODENOSCOPY  10/29/2013   Normal EGD   HIP ARTHROPLASTY Left    INTRAUTERINE DEVICE (IUD) INSERTION  02/2009   OTHER SURGICAL HISTORY     Ureteral Surgery    SHOULDER ARTHROSCOPY Right    TONSILLECTOMY     TOTAL HIP ARTHROPLASTY Left 07/11/2020   Procedure: LEFT TOTAL HIP ARTHROPLASTY ANTERIOR APPROACH;  Surgeon: Kathryne Hitch, MD;  Location: [facility];  Service: Orthopedics;  Laterality: Left;   Social History   Occupational History   Not on file  Tobacco Use   Smoking status: Former    Current packs/day: 0.00    Types: Cigarettes    Quit date: 2004  Years since quitting: 21.1   Smokeless tobacco: Never  Vaping Use   Vaping status: Never Used  Substance and Sexual Activity   Alcohol use: Yes    Alcohol/week: 3.0 standard drinks of alcohol    Types: 3 Glasses of wine per week    Comment: weekly   Drug use: Never   Sexual activity: Yes

## 2023-09-08 NOTE — Progress Notes (Signed)
 Patient says that she began having right hip pain after having her left hip replaced. She describes her pain as a deep ache which comes and goes; her pain increases with more activity, although rest does not seem to alleviate it either. She is currently taking a break from Ibuprofen and her arthritis medication, but has not noticed much difference in the last couple of weeks without these. She has not done physical therapy for this hip but does have the referral to begin after her evaluation with Dr. Shon Baton. She denies and pain, numbness, or tingling down the leg.  Patient was instructed in 10 minutes of therapeutic exercises for right hip to improve strength, ROM and function according to my instructions and plan of care by a Certified Athletic Trainer during the office visit. A customized handout was provided and demonstration of proper technique shown and discussed. Patient did perform exercises and demonstrate understanding through teachback.  All questions discussed and answered.

## 2023-09-12 ENCOUNTER — Other Ambulatory Visit (HOSPITAL_COMMUNITY): Payer: Self-pay

## 2023-09-12 DIAGNOSIS — Z79899 Other long term (current) drug therapy: Secondary | ICD-10-CM | POA: Diagnosis not present

## 2023-09-12 DIAGNOSIS — M0600 Rheumatoid arthritis without rheumatoid factor, unspecified site: Secondary | ICD-10-CM | POA: Diagnosis not present

## 2023-09-12 DIAGNOSIS — R5383 Other fatigue: Secondary | ICD-10-CM | POA: Diagnosis not present

## 2023-09-12 DIAGNOSIS — M1991 Primary osteoarthritis, unspecified site: Secondary | ICD-10-CM | POA: Diagnosis not present

## 2023-09-12 DIAGNOSIS — Z6823 Body mass index (BMI) 23.0-23.9, adult: Secondary | ICD-10-CM | POA: Diagnosis not present

## 2023-09-14 ENCOUNTER — Other Ambulatory Visit (HOSPITAL_COMMUNITY): Payer: Self-pay

## 2023-09-15 ENCOUNTER — Encounter: Payer: Self-pay | Admitting: Sports Medicine

## 2023-09-15 ENCOUNTER — Ambulatory Visit: Admitting: Sports Medicine

## 2023-09-15 DIAGNOSIS — M25551 Pain in right hip: Secondary | ICD-10-CM

## 2023-09-15 DIAGNOSIS — G8929 Other chronic pain: Secondary | ICD-10-CM | POA: Diagnosis not present

## 2023-09-15 NOTE — Progress Notes (Signed)
 Alice Klein - 48 y.o. female MRN [id number]  Date of birth: 04-Oct-1975  Office Visit Note: Visit Date: 09/15/2023 PCP: Buckner Malta, MD Referred by: Buckner Malta, MD  Subjective: Chief Complaint  Patient presents with   Right Hip - Pain   HPI: Alice Klein is a pleasant 48 y.o. female who presents today for chronic right lateral hip pain with undersurface gluteal tendon tearing/tendinosis.  Still having right lateral hip pain that is bothersome - hurts to lie on at night. She has started her home rehab exercises daily, tolerating well, she is having some pain with lateral step ups and crossovers.  She is interested in proceeding with extracorporeal shockwave therapy.  Pertinent ROS were reviewed with the patient and found to be negative unless otherwise specified above in HPI.   Assessment & Plan: Visit Diagnoses:  1. Chronic right hip pain   2. Greater trochanteric pain syndrome of right lower extremity    Plan: Impression is chronic lateral hip pain with some weakness about the hips and insufficiency of the gluteal tendons.  We did proceed with our first trial of extracorporeal shockwave therapy, patient tolerated today.  She will continue her home exercises on a daily basis, we did make modifications for her to not push through pain with these.  She will follow-up next week and after 2 treatments of extracorporeal shockwave therapy we will see what sort of cumulative benefit she has going forward.  Follow-up: Return in about 1 week (around 09/22/2023) for already appt set.   Meds & Orders: No orders of the defined types were placed in this encounter.  No orders of the defined types were placed in this encounter.    Procedures: Procedure: ECSWT Indications: Greater trochanteric pain syndrome/gluteal tendinopathy   Procedure Details Consent: Risks of procedure as well as the alternatives and risks of each were explained to the patient.  Verbal consent for  procedure obtained. Time Out: Verified patient identification, verified procedure, site was marked, verified correct patient position. The area was cleaned with alcohol swab.     The right greater trochanteric region and gluteus medius/minimus was targeted for Extracorporeal shockwave therapy.    Preset: Trochanteric bursitis Power Level: 110 mJ Frequency: 12 Hz Impulse/cycles: 3000 Head size: Regular   Patient tolerated procedure well without immediate complications.         Clinical History: No specialty comments available.  She reports that she quit smoking about 21 years ago. Her smoking use included cigarettes. She has never used smokeless tobacco. No results for input(s): "HGBA1C", "LABURIC" in the last 8760 hours.  Objective:    Physical Exam  Gen: Well-appearing, in no acute distress; non-toxic CV: Well-perfused. Warm.  Resp: Breathing unlabored on room air; no wheezing. Psych: Fluid speech in conversation; appropriate affect; normal thought process  Ortho Exam - Right hip: + TTP of the posterior aspect of the greater trochanteric and the insertion of the gluteus medius and minimus.  No this or swelling.  Otherwise unchanged exam from prior visit.  Imaging: No results found.  Past Medical/Family/Surgical/Social History: Medications & Allergies reviewed per EMR, new medications updated. Patient Active Problem List   Diagnosis Date Noted   Closed right ankle fracture 05/14/2022   Status post hip replacement 07/11/2020   Unilateral primary osteoarthritis, left hip 07/10/2020   Palpitations 08/23/2019   Past Medical History:  Diagnosis Date   Allergies    Anxiety    Chronic kidney disease    uritral re-implantation   GERD (  gastroesophageal reflux disease)    Headache    Palpitations 08/23/2019   Pneumonia    Reactive airway disease    Seronegative rheumatoid arthritis (HCC)    SVT (supraventricular tachycardia) (HCC)    UTI (urinary tract infection)     Family History  Problem Relation Age of Onset   Colon polyps Mother    Hypertension Mother    Hypertension Father    Stroke Brother    Colon cancer Neg Hx    Past Surgical History:  Procedure Laterality Date   DILATION AND CURETTAGE OF UTERUS  2009   ESOPHAGOGASTRODUODENOSCOPY  10/29/2013   Normal EGD   HIP ARTHROPLASTY Left    INTRAUTERINE DEVICE (IUD) INSERTION  02/2009   OTHER SURGICAL HISTORY     Ureteral Surgery    SHOULDER ARTHROSCOPY Right    TONSILLECTOMY     TOTAL HIP ARTHROPLASTY Left 07/11/2020   Procedure: LEFT TOTAL HIP ARTHROPLASTY ANTERIOR APPROACH;  Surgeon: Kathryne Hitch, MD;  Location: [facility];  Service: Orthopedics;  Laterality: Left;   Social History   Occupational History   Not on file  Tobacco Use   Smoking status: Former    Current packs/day: 0.00    Types: Cigarettes    Quit date: 2004    Years since quitting: 21.2   Smokeless tobacco: Never  Vaping Use   Vaping status: Never Used  Substance and Sexual Activity   Alcohol use: Yes    Alcohol/week: 3.0 standard drinks of alcohol    Types: 3 Glasses of wine per week    Comment: weekly   Drug use: Never   Sexual activity: Yes

## 2023-09-15 NOTE — Progress Notes (Signed)
 Patient says that her exercises are going well at home and she is just having some soreness. No new symptoms since her last visit. She is here for first shockwave treatment today.

## 2023-09-22 ENCOUNTER — Ambulatory Visit: Admitting: Sports Medicine

## 2023-09-22 ENCOUNTER — Encounter: Payer: Self-pay | Admitting: Sports Medicine

## 2023-09-22 DIAGNOSIS — M25551 Pain in right hip: Secondary | ICD-10-CM | POA: Diagnosis not present

## 2023-09-22 DIAGNOSIS — G8929 Other chronic pain: Secondary | ICD-10-CM | POA: Diagnosis not present

## 2023-09-22 NOTE — Progress Notes (Signed)
 Patient says that she is feeling okay. She has had busy shifts at work the last two nights and thinks that it has aggravated her hip. She says that Tuesday prior to her shift she felt pretty good. She also went on a college tour with her daughter on Monday and walked a total of about 6 miles. She says that due to time and being on her feet more this week she has not done her home exercises as much, but they are otherwise going well.

## 2023-09-22 NOTE — Progress Notes (Addendum)
 Alice Klein - 48 y.o. female MRN [id number]  Date of birth: 09-18-1975  Office Visit Note: Visit Date: 09/22/2023 PCP: Buckner Malta, MD Referred by: Buckner Malta, MD  Subjective: Chief Complaint  Patient presents with   Right Hip - Follow-up   HPI: Alice Klein is a pleasant 48 y.o. female who presents today for follow-up of chronic right lateral hip pain with undersurface gluteal tendon tearing/tendinosis.   She did feel pretty good following our first extracorporeal shockwave treatment, she has had a few busy shifts and did walk about 6 miles on a college tour with her daughter and feels like this started to tighten up that area, in general did note some improvement however.  Continues doing her exercises when able at home.  Pertinent ROS were reviewed with the patient and found to be negative unless otherwise specified above in HPI.   Assessment & Plan: Visit Diagnoses:  1. Chronic right hip pain   2. Greater trochanteric pain syndrome of right lower extremity    Plan: Alice Klein has made some improvement after our first treatment of extracorporeal shockwave therapy, we did repeat this today.  Discussed performing a few cumulative treatments and seeing after her third treatment if she is getting lasting and progressive relief.  She will continue her home exercises at least 3-4 times weekly.  We discussed other treatment options going forward including PRP injection. F/u in about 1-week for repeat treatment and additional ECSWT.  Follow-up: Return in about 1 week (around 09/29/2023) for about 1-week for R-lat hip.   Meds & Orders: No orders of the defined types were placed in this encounter.  No orders of the defined types were placed in this encounter.    Procedures: Procedure: ECSWT Indications: Greater trochanteric pain syndrome/gluteal tendinopathy   Procedure Details Consent: Risks of procedure as well as the alternatives and risks of each were explained to  the patient.  Verbal consent for procedure obtained. Time Out: Verified patient identification, verified procedure, site was marked, verified correct patient position. The area was cleaned with alcohol swab.     The right greater trochanteric region and gluteus medius/minimus was targeted for Extracorporeal shockwave therapy.    Preset: Trochanteric bursitis Power Level: 120 mJ Frequency: 13 Hz Impulse/cycles: 3200 Head size: Regular   Patient tolerated procedure well without immediate complications.      Clinical History: No specialty comments available.  She reports that she quit smoking about 21 years ago. Her smoking use included cigarettes. She has never used smokeless tobacco. No results for input(s): "HGBA1C", "LABURIC" in the last 8760 hours.  Objective:    Physical Exam  Gen: Well-appearing, in no acute distress; non-toxic CV: Well-perfused. Warm.  Resp: Breathing unlabored on room air; no wheezing. Psych: Fluid speech in conversation; appropriate affect; normal thought process  Ortho Exam - Right lateral hip: + TTP over the posterior aspect of the greater trochanter near the insertion of the gluteus medius and minimus.  Otherwise unchanged exam from prior visit.  Imaging: No results found.  Past Medical/Family/Surgical/Social History: Medications & Allergies reviewed per EMR, new medications updated. Patient Active Problem List   Diagnosis Date Noted   Closed right ankle fracture 05/14/2022   Status post hip replacement 07/11/2020   Unilateral primary osteoarthritis, left hip 07/10/2020   Palpitations 08/23/2019   Past Medical History:  Diagnosis Date   Allergies    Anxiety    Chronic kidney disease    uritral re-implantation   GERD (gastroesophageal reflux disease)  Headache    Palpitations 08/23/2019   Pneumonia    Reactive airway disease    Seronegative rheumatoid arthritis (HCC)    SVT (supraventricular tachycardia) (HCC)    UTI (urinary tract  infection)    Family History  Problem Relation Age of Onset   Colon polyps Mother    Hypertension Mother    Hypertension Father    Stroke Brother    Colon cancer Neg Hx    Past Surgical History:  Procedure Laterality Date   DILATION AND CURETTAGE OF UTERUS  2009   ESOPHAGOGASTRODUODENOSCOPY  10/29/2013   Normal EGD   HIP ARTHROPLASTY Left    INTRAUTERINE DEVICE (IUD) INSERTION  02/2009   OTHER SURGICAL HISTORY     Ureteral Surgery    SHOULDER ARTHROSCOPY Right    TONSILLECTOMY     TOTAL HIP ARTHROPLASTY Left 07/11/2020   Procedure: LEFT TOTAL HIP ARTHROPLASTY ANTERIOR APPROACH;  Surgeon: Kathryne Hitch, MD;  Location: [facility];  Service: Orthopedics;  Laterality: Left;   Social History   Occupational History   Not on file  Tobacco Use   Smoking status: Former    Current packs/day: 0.00    Types: Cigarettes    Quit date: 2004    Years since quitting: 21.2   Smokeless tobacco: Never  Vaping Use   Vaping status: Never Used  Substance and Sexual Activity   Alcohol use: Yes    Alcohol/week: 3.0 standard drinks of alcohol    Types: 3 Glasses of wine per week    Comment: weekly   Drug use: Never   Sexual activity: Yes

## 2023-09-25 ENCOUNTER — Other Ambulatory Visit (HOSPITAL_COMMUNITY): Payer: Self-pay

## 2023-09-26 ENCOUNTER — Other Ambulatory Visit: Payer: Self-pay

## 2023-09-26 ENCOUNTER — Other Ambulatory Visit (HOSPITAL_COMMUNITY): Payer: Self-pay

## 2023-09-26 MED ORDER — ALPRAZOLAM 0.5 MG PO TABS
0.5000 mg | ORAL_TABLET | Freq: Two times a day (BID) | ORAL | 3 refills | Status: DC
  Filled 2023-09-26: qty 60, 30d supply, fill #0

## 2023-09-30 ENCOUNTER — Ambulatory Visit: Admitting: Sports Medicine

## 2023-10-06 ENCOUNTER — Encounter: Payer: Self-pay | Admitting: Sports Medicine

## 2023-10-06 ENCOUNTER — Ambulatory Visit: Admitting: Sports Medicine

## 2023-10-06 DIAGNOSIS — M25551 Pain in right hip: Secondary | ICD-10-CM

## 2023-10-06 DIAGNOSIS — G8929 Other chronic pain: Secondary | ICD-10-CM

## 2023-10-06 NOTE — Progress Notes (Signed)
 Alice Klein - 48 y.o. female MRN [id number]  Date of birth: 21-Oct-1975  Office Visit Note: Visit Date: 10/06/2023 PCP: Buckner Malta, MD Referred by: Buckner Malta, MD  Subjective: Chief Complaint  Patient presents with   Right Hip - Follow-up   HPI: Alice Klein is a pleasant 48 y.o. female who presents today for follow-up of right lateral hip.  We have done 2 treatments of extracorporeal shockwave therapy thus far, she does note she had been noticing improvement with this.  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  An episode when they were walking many miles at [facility] where she felt a sharp pain deep within the hip joint.  Most of her pain however still localizes over the lateral hip itself.   In the past she has taken Celebrex 100 mg once to twice daily but has taken a drug holiday from this recently given previously elevated labs that have normalized.  Pertinent ROS were reviewed with the patient and found to be negative unless otherwise specified above in HPI.   Assessment & Plan: Visit Diagnoses:  1. Greater trochanteric pain syndrome of right lower extremity   2. Chronic right hip pain    Plan: Impression is chronic right lateral hip pain with some undersurface wear and tear and insufficiency of the gluteal tendons.  She has been noting response from extracorporeal shockwave therapy but has had a few brief setbacks given her increased activity.  We discussed treatment options such as continuing shockwave versus nitroglycerin patch protocol versus a one-time ultrasound-guided injection.  At this point, she does feel she is making some improvement from shockwave and would like to proceed with an additional treatment, this was performed today.  I did provide her information regarding nitroglycerin patch protocol, she will let me know if she would like to add this to  her regimen to improve blood flow and augment tendon healing.  Given her recent exacerbation, I am okay with her taking 2 to 3 days of Celebrex 100 mg twice daily and then discontinuing.  She will let me know in the middle of next week her degree of relief and we will discuss next steps.  Follow-up: Return for will send me update in 1 week.   Meds & Orders: No orders of the defined types were placed in this encounter.  No orders of the defined types were placed in this encounter.    Procedures: Procedure: ECSWT Indications: Greater trochanteric pain syndrome/gluteal tendinopathy   Procedure Details Consent: Risks of procedure as well as the alternatives and risks of each were explained to the patient.  Verbal consent for procedure obtained. Time Out: Verified patient identification, verified procedure, site was marked, verified correct patient position. The area was cleaned with alcohol swab.     The right greater trochanteric region and gluteus medius/minimus was targeted for Extracorporeal shockwave therapy.    Preset: Trochanteric bursitis Power Level: 120 mJ Frequency: 14 Hz Impulse/cycles: 3300 Head size: Regular   Patient tolerated procedure well without immediate complications.      Clinical History: No specialty comments available.  She reports that she quit smoking about 21 years ago. Her smoking use included cigarettes. She has never used smokeless tobacco. No results for input(s): "HGBA1C", "LABURIC" in the last 8760 hours.  Objective:    Physical Exam  Gen: Well-appearing, in no acute distress; non-toxic CV:  Well-perfused. Warm.  Resp: Breathing unlabored on room air; no wheezing. Psych: Fluid speech in conversation; appropriate affect; normal thought process  Ortho Exam - Right hip: + TTP around the greater trochanter and the anterior facet.  No redness swelling or effusion.  There is still some weakness with resisted hip abduction compared to full strength to the  contralateral side.  Negative FADIR, negative Stinchfield today.  Imaging:  Narrative & Impression  CLINICAL DATA:  Right hip pain over the last 3 months   EXAM: MR OF THE RIGHT HIP WITHOUT CONTRAST   TECHNIQUE: Multiplanar, multisequence MR imaging was performed. No intravenous contrast was administered.   COMPARISON:  Radiographs 06/20/2023   FINDINGS: Bones: Contralateral (left) total hip prosthesis. This introduces field heterogeneity into the fat saturated images.   No definite marrow edema in the right hip or acetabulum. Minimal spurring of the right femoral head. Normal right femoral head morphology.   Articular cartilage and labrum   Articular cartilage:  Unremarkable   Labrum:  Grossly unremarkable   Joint or bursal effusion   Joint effusion:  Absent   Bursae: No regional bursitis   Muscles and tendons   Muscles and tendons:  Unremarkable   Other findings   Miscellaneous:   Small uterine fibroids.   IMPRESSION: 1. Minimal spurring of the right femoral head. No acute findings. 2. Contralateral (left) total hip prosthesis. 3. Small uterine fibroids.     Electronically Signed   By: Gaylyn Rong M.D.   On: 08/08/2023 11:25   Past Medical/Family/Surgical/Social History: Medications & Allergies reviewed per EMR, new medications updated. Patient Active Problem List   Diagnosis Date Noted   Closed right ankle fracture 05/14/2022   Status post hip replacement 07/11/2020   Unilateral primary osteoarthritis, left hip 07/10/2020   Palpitations 08/23/2019   Past Medical History:  Diagnosis Date   Allergies    Anxiety    Chronic kidney disease    uritral re-implantation   GERD (gastroesophageal reflux disease)    Headache    Palpitations 08/23/2019   Pneumonia    Reactive airway disease    Seronegative rheumatoid arthritis (HCC)    SVT (supraventricular tachycardia) (HCC)    UTI (urinary tract infection)    Family History  Problem  Relation Age of Onset   Colon polyps Mother    Hypertension Mother    Hypertension Father    Stroke Brother    Colon cancer Neg Hx    Past Surgical History:  Procedure Laterality Date   DILATION AND CURETTAGE OF UTERUS  2009   ESOPHAGOGASTRODUODENOSCOPY  10/29/2013   Normal EGD   HIP ARTHROPLASTY Left    INTRAUTERINE DEVICE (IUD) INSERTION  02/2009   OTHER SURGICAL HISTORY     Ureteral Surgery    SHOULDER ARTHROSCOPY Right    TONSILLECTOMY     TOTAL HIP ARTHROPLASTY Left 07/11/2020   Procedure: LEFT TOTAL HIP ARTHROPLASTY ANTERIOR APPROACH;  Surgeon: Kathryne Hitch, MD;  Location: [facility];  Service: Orthopedics;  Laterality: Left;   Social History   Occupational History   Not on file  Tobacco Use   Smoking status: Former    Current packs/day: 0.00    Types: Cigarettes    Quit date: 2004    Years since quitting: 21.2   Smokeless tobacco: Never  Vaping Use   Vaping status: Never Used  Substance and Sexual Activity   Alcohol use: Yes    Alcohol/week: 3.0 standard drinks of alcohol    Types: 3 Glasses  of wine per week    Comment: weekly   Drug use: Never   Sexual activity: Yes

## 2023-10-06 NOTE — Patient Instructions (Signed)
 [place],  Here is the information on the nitroglycerin patch for blood flow augementation and tendon healing. Let me know if interested.  Nitroglycerin Protocol  Apply 1/4 nitroglycerin patch to affected area daily. Change position of patch within the affected area every 24 hours. You may experience a headache during the first 1-2 weeks of using the patch, these should subside. If you experience headaches after beginning nitroglycerin patch treatment, you may take your preferred over the counter pain reliever. Another side effect of the nitroglycerin patch is skin irritation or rash related to patch adhesive. Please notify our office if you develop more severe headaches or rash, and stop the patch. Tendon healing with nitroglycerin patch may require 12 to 24 weeks depending on the extent of injury. Men should not use if taking Viagra, Cialis, or Levitra.  Do not use if you have migraines or rosacea.

## 2023-10-06 NOTE — Progress Notes (Signed)
 Patient says that she is not feeling great today. 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 She says that yesterday while walking she did have a sharp pain that felt different than what she has felt in the past. Today she reports feeling worse pain than she felt at her visit last week. She did notice some improvement from last shockwave treatment leading up to her shift and college tour in the last two days.

## 2023-10-13 ENCOUNTER — Encounter: Payer: Self-pay | Admitting: Sports Medicine

## 2023-10-19 ENCOUNTER — Ambulatory Visit: Admitting: Sports Medicine

## 2023-10-19 ENCOUNTER — Encounter: Payer: Self-pay | Admitting: Sports Medicine

## 2023-10-19 DIAGNOSIS — M25551 Pain in right hip: Secondary | ICD-10-CM

## 2023-10-19 DIAGNOSIS — G8929 Other chronic pain: Secondary | ICD-10-CM | POA: Diagnosis not present

## 2023-10-19 NOTE — Progress Notes (Signed)
 Patient says that yesterday her RA seemed to flare up a bit overall. She says that she is still having some of that today. She says that the hip itself seems to be okay. She is having some feeling of weakness closer to ASIS on the right hip that began about halfway between her last visit and today.

## 2023-10-19 NOTE — Progress Notes (Signed)
 Alice Klein - 48 y.o. female MRN [id number]  Date of birth: 23-Dec-1975  Office Visit Note: Visit Date: 10/19/2023 PCP: Buckner Malta, MD Referred by: Buckner Malta, MD  Subjective: Chief Complaint  Patient presents with   Right Hip - Follow-up   HPI: Alice Klein is a pleasant 48 y.o. female who presents today for follow-up of right lateral hip pain.  In general, the lateral hip is improving.  We have done 3 total treatments of extracorporeal shockwave therapy and she has been doing her home exercises.  She is somewhere between 25-40% improved depending on the day.  Self is doing better, she has had some overall joint pain from her RA.  Has used very infrequent Celebrex.  Pertinent ROS were reviewed with the patient and found to be negative unless otherwise specified above in HPI.   Assessment & Plan: Visit Diagnoses:  1. Greater trochanteric pain syndrome of right lower extremity   2. Chronic right hip pain    Plan: Impression is improving chronic right lateral hip pain with undersurface wear and tear and insufficiency of the gluteal tendons.  She has been making improvements with shockwave therapy and HEP, we did repeat ESWT today.  I would like to perform 1 more treatment and reevaluation over the next 1-2 weeks and then at that point if she is greater than 50% improved we may continue this versus adding nitroglycerin patch protocol.  If she is not at least 50% improved after these next 2 treatments, may consider ultrasound-guided injection. Continue HEP.  Follow-up: Return for f/u in next 1-2 weeks for R-lat hip.   Meds & Orders: No orders of the defined types were placed in this encounter.  No orders of the defined types were placed in this encounter.    Procedures: Procedure: ECSWT Indications: Greater trochanteric pain syndrome/gluteal tendinopathy   Procedure Details Consent: Risks of procedure as well as the alternatives and risks of each were  explained to the patient.  Verbal consent for procedure obtained. Time Out: Verified patient identification, verified procedure, site was marked, verified correct patient position. The area was cleaned with alcohol swab.     The right greater trochanteric region and gluteus medius/minimus was targeted for Extracorporeal shockwave therapy.    Preset: Trochanteric bursitis Power Level: 120 mJ Frequency: 14 Hz Impulse/cycles: 3400 Head size: Regular   Patient tolerated procedure well without immediate complications.      Clinical History: No specialty comments available.  She reports that she quit smoking about 21 years ago. Her smoking use included cigarettes. She has never used smokeless tobacco. No results for input(s): "HGBA1C", "LABURIC" in the last 8760 hours.  Objective:    Physical Exam  Gen: Well-appearing, in no acute distress; non-toxic CV: Well-perfused. Warm.  Resp: Breathing unlabored on room air; no wheezing. Psych: Fluid speech in conversation; appropriate affect; normal thought process  Ortho Exam - Right hip: + TTP overlying the greater trochanteric region and more so the anterior facet.  There is no redness swelling or effusion.  There is improved strength with resisted hip abduction, although not quite full strength.  Imaging: No results found.  Past Medical/Family/Surgical/Social History: Medications & Allergies reviewed per EMR, new medications updated. Patient Active Problem List   Diagnosis Date Noted   Closed right ankle fracture 05/14/2022   Status post hip replacement 07/11/2020   Unilateral primary osteoarthritis, left hip 07/10/2020   Palpitations 08/23/2019   Past Medical History:  Diagnosis Date   Allergies  Anxiety    Chronic kidney disease    uritral re-implantation   GERD (gastroesophageal reflux disease)    Headache    Palpitations 08/23/2019   Pneumonia    Reactive airway disease    Seronegative rheumatoid arthritis (HCC)    SVT  (supraventricular tachycardia) (HCC)    UTI (urinary tract infection)    Family History  Problem Relation Age of Onset   Colon polyps Mother    Hypertension Mother    Hypertension Father    Stroke Brother    Colon cancer Neg Hx    Past Surgical History:  Procedure Laterality Date   DILATION AND CURETTAGE OF UTERUS  2009   ESOPHAGOGASTRODUODENOSCOPY  10/29/2013   Normal EGD   HIP ARTHROPLASTY Left    INTRAUTERINE DEVICE (IUD) INSERTION  02/2009   OTHER SURGICAL HISTORY     Ureteral Surgery    SHOULDER ARTHROSCOPY Right    TONSILLECTOMY     TOTAL HIP ARTHROPLASTY Left 07/11/2020   Procedure: LEFT TOTAL HIP ARTHROPLASTY ANTERIOR APPROACH;  Surgeon: Arnie Lao, MD;  Location: [facility];  Service: Orthopedics;  Laterality: Left;   Social History   Occupational History   Not on file  Tobacco Use   Smoking status: Former    Current packs/day: 0.00    Types: Cigarettes    Quit date: 2004    Years since quitting: 21.3   Smokeless tobacco: Never  Vaping Use   Vaping status: Never Used  Substance and Sexual Activity   Alcohol use: Yes    Alcohol/week: 3.0 standard drinks of alcohol    Types: 3 Glasses of wine per week    Comment: weekly   Drug use: Never   Sexual activity: Yes

## 2023-10-27 ENCOUNTER — Ambulatory Visit: Admitting: Sports Medicine

## 2023-10-27 ENCOUNTER — Encounter: Payer: Self-pay | Admitting: Sports Medicine

## 2023-10-27 DIAGNOSIS — M25551 Pain in right hip: Secondary | ICD-10-CM | POA: Diagnosis not present

## 2023-10-27 DIAGNOSIS — G8929 Other chronic pain: Secondary | ICD-10-CM | POA: Diagnosis not present

## 2023-10-27 NOTE — Progress Notes (Signed)
 Patient says that she is feeling a bit better since last visit. She says that her pain/discomfort seems less intense.

## 2023-10-27 NOTE — Progress Notes (Signed)
 Alice Klein - 48 y.o. female MRN [id number]  Date of birth: May 06, 1976  Office Visit Note: Visit Date: 10/27/2023 PCP: Harvest Lineman, MD Referred by: Harvest Lineman, MD  Subjective: Chief Complaint  Patient presents with   Right Hip - Follow-up   HPI: Alice Klein is a pleasant 48 y.o. female who presents today for follow-up of right lateral hip pain.  [name] is definitely noticing improvements with shockwave treatments.  We have performed 4 total treatments of extracorporeal shockwave therapy and she has been doing her home exercises intermittently through the weeks.  At this point she feels she is about 60-70% improved.  She did have some questions regarding perimenopausal involvement given her overall joint and other body pain.  Pertinent ROS were reviewed with the patient and found to be negative unless otherwise specified above in HPI.   Assessment & Plan: Visit Diagnoses:  1. Greater trochanteric pain syndrome of right lower extremity   2. Chronic right hip pain    Plan: Impression is improving chronic right lateral hip pain with undersurface wear and tear and insufficiency of the gluteal tendons.  She is about 60 to 70% improved after 4 treatments as well as her home therapy.  I did reiterate the consistency of her home therapy to maintain gluteal activation and hip symmetry from her stabilization muscles/tendons.  We had a brief discussion regarding perimenopausal hormone involvement and options for this.  I would like her to continue getting back into a number of physical activity such as yoga, running/aerobic therapy, and weight lifting.  I think the assortment of each of these would prove beneficial for her hips and overall body.  She may use her Celebrex  as needed.  Could consider adding nitroglycerin patch protocol as well but given her improvement with shockwave we will plan on a few more treatments until she is about 90+ percent improved and then take a  4-6-week holiday from treatment to see the further cumulative benefit.  She is agreeable and understanding to this plan.  She will follow-up next week for repeat treatment and assessment.  Follow-up: Return for f/u 7-10 days R-hip (ok for injection slot if needed).   Meds & Orders: No orders of the defined types were placed in this encounter.  No orders of the defined types were placed in this encounter.    Procedures: Procedure: ECSWT Indications: Greater trochanteric pain syndrome/gluteal tendinopathy   Procedure Details Consent: Risks of procedure as well as the alternatives and risks of each were explained to the patient.  Verbal consent for procedure obtained. Time Out: Verified patient identification, verified procedure, site was marked, verified correct patient position. The area was cleaned with alcohol  swab.     The right greater trochanteric region and gluteus medius/minimus was targeted for Extracorporeal shockwave therapy.    Preset: Trochanteric bursitis Power Level: 120 mJ     Frequency: 14 Hz Impulse/cycles: 3500 Head size: Regular   Patient tolerated procedure well without immediate complications.       Clinical History: No specialty comments available.  She reports that she quit smoking about 21 years ago. Her smoking use included cigarettes. She has never used smokeless tobacco. No results for input(s): "HGBA1C", "LABURIC" in the last 8760 hours.  Objective:    Physical Exam  Gen: Well-appearing, in no acute distress; non-toxic CV: Well-perfused. Warm.  Resp: Breathing unlabored on room air; no wheezing. Psych: Fluid speech in conversation; appropriate affect; normal thought process  Ortho Exam - Right hip: +  TTP over the greater trochanteric region and the lateral facet.  No redness swelling or effusion here.  Imaging: No results found.  Past Medical/Family/Surgical/Social History: Medications & Allergies reviewed per EMR, new medications  updated. Patient Active Problem List   Diagnosis Date Noted   Closed right ankle fracture 05/14/2022   Status post hip replacement 07/11/2020   Unilateral primary osteoarthritis, left hip 07/10/2020   Palpitations 08/23/2019   Past Medical History:  Diagnosis Date   Allergies    Anxiety    Chronic kidney disease    uritral re-implantation   GERD (gastroesophageal reflux disease)    Headache    Palpitations 08/23/2019   Pneumonia    Reactive airway disease    Seronegative rheumatoid arthritis (HCC)    SVT (supraventricular tachycardia) (HCC)    UTI (urinary tract infection)    Family History  Problem Relation Age of Onset   Colon polyps Mother    Hypertension Mother    Hypertension Father    Stroke Brother    Colon cancer Neg Hx    Past Surgical History:  Procedure Laterality Date   DILATION AND CURETTAGE OF UTERUS  2009   ESOPHAGOGASTRODUODENOSCOPY  10/29/2013   Normal EGD   HIP ARTHROPLASTY Left    INTRAUTERINE DEVICE (IUD) INSERTION  02/2009   OTHER SURGICAL HISTORY     Ureteral Surgery    SHOULDER ARTHROSCOPY Right    TONSILLECTOMY     TOTAL HIP ARTHROPLASTY Left 07/11/2020   Procedure: LEFT TOTAL HIP ARTHROPLASTY ANTERIOR APPROACH;  Surgeon: Arnie Lao, MD;  Location: [facility];  Service: Orthopedics;  Laterality: Left;   Social History   Occupational History   Not on file  Tobacco Use   Smoking status: Former    Current packs/day: 0.00    Types: Cigarettes    Quit date: 2004    Years since quitting: 21.3   Smokeless tobacco: Never  Vaping Use   Vaping status: Never Used  Substance and Sexual Activity   Alcohol  use: Yes    Alcohol /week: 3.0 standard drinks of alcohol     Types: 3 Glasses of wine per week    Comment: weekly   Drug use: Never   Sexual activity: Yes

## 2023-11-02 ENCOUNTER — Other Ambulatory Visit: Payer: Self-pay

## 2023-11-02 ENCOUNTER — Other Ambulatory Visit (HOSPITAL_COMMUNITY): Payer: Self-pay

## 2023-11-07 ENCOUNTER — Ambulatory Visit: Admitting: Sports Medicine

## 2023-11-09 ENCOUNTER — Ambulatory Visit: Admitting: Sports Medicine

## 2023-11-09 ENCOUNTER — Encounter: Payer: Self-pay | Admitting: Sports Medicine

## 2023-11-09 ENCOUNTER — Other Ambulatory Visit: Payer: Self-pay

## 2023-11-09 DIAGNOSIS — M25551 Pain in right hip: Secondary | ICD-10-CM | POA: Diagnosis not present

## 2023-11-09 DIAGNOSIS — G8929 Other chronic pain: Secondary | ICD-10-CM | POA: Diagnosis not present

## 2023-11-09 NOTE — Progress Notes (Signed)
 Patient says that she has had a good week and does feel she is improving. She says that today she feels about 75% improved overall since beginning the shockwave therapy.

## 2023-11-09 NOTE — Progress Notes (Signed)
 Alice Klein - 48 y.o. female MRN [id number]  Date of birth: [birth year]  Office Visit Note: Visit Date: 11/09/2023 PCP: Harvest Lineman, MD Referred by: Harvest Lineman, MD  Subjective: Chief Complaint  Patient presents with   Right Hip - Follow-up   HPI: Alice Klein is a pleasant 48 y.o. female who presents today for follow-up of right lateral hip pain.   Alice Klein is definitely noticing improvements with shockwave treatments. We have performed 5 total treatments of extracorporeal shockwave therapy and she feels like she is at least 75% improved.  Having less pain about the lateral hip.  Not requiring any medication for this.  Pertinent ROS were reviewed with the patient and found to be negative unless otherwise specified above in HPI.   Assessment & Plan: Visit Diagnoses:  1. Greater trochanteric pain syndrome of right lower extremity   2. Chronic right hip pain    Plan: Impression is improving chronic right lateral hip pain with undersurface wear and tear and insufficiency of the gluteal tendons.  She is at least 75% improved after a number of treatments of extracorporeal shockwave therapy as well as continuing her home rehab.  We did repeat ESWT today.  I would like to see her back next week for 1 additional shockwave treatment and further evaluation.  When she is about 90+ percent improved, we will take a holiday from treatment for 4 to 6 weeks and see further cumulative benefit.  Discussed adding nitroglycerin patch at that time if not at least 90% better. F/u 1 week.  Follow-up: Return in about 1 week (around 11/16/2023) for for R-lat hip (reg visit).   Meds & Orders: No orders of the defined types were placed in this encounter.  No orders of the defined types were placed in this encounter.    Procedures: Procedure: ECSWT Indications: Greater trochanteric pain syndrome/gluteal tendinopathy   Procedure Details Consent: Risks of procedure as well as the  alternatives and risks of each were explained to the patient.  Verbal consent for procedure obtained. Time Out: Verified patient identification, verified procedure, site was marked, verified correct patient position. The area was cleaned with alcohol  swab.     The right greater trochanteric region and gluteus medius/minimus was targeted for Extracorporeal shockwave therapy.    Preset: Trochanteric bursitis Power Level: 120 mJ     Frequency: 14 Hz Impulse/cycles: 3800 Head size: Regular   Patient tolerated procedure well without immediate complications.      Clinical History: No specialty comments available.  She reports that she quit smoking about 21 years ago. Her smoking use included cigarettes. She has never used smokeless tobacco. No results for input(s): "HGBA1C", "LABURIC" in the last 8760 hours.  Objective:    Physical Exam  Gen: Well-appearing, in no acute distress; non-toxic CV: Well-perfused. Warm.  Resp: Breathing unlabored on room air; no wheezing. Psych: Fluid speech in conversation; appropriate affect; normal thought process  Ortho Exam - Right hip: There is improved tenderness over the greater trochanteric region.  No redness swelling or effusion here.  Hip testing strength not tested today.  Imaging: No results found.  Past Medical/Family/Surgical/Social History: Medications & Allergies reviewed per EMR, new medications updated. Patient Active Problem List   Diagnosis Date Noted   Closed right ankle fracture 05/14/2022   Status post hip replacement 07/11/2020   Unilateral primary osteoarthritis, left hip 07/10/2020   Palpitations 08/23/2019   Past Medical History:  Diagnosis Date   Allergies    Anxiety  Chronic kidney disease    uritral re-implantation   GERD (gastroesophageal reflux disease)    Headache    Palpitations 08/23/2019   Pneumonia    Reactive airway disease    Seronegative rheumatoid arthritis (HCC)    SVT (supraventricular  tachycardia) (HCC)    UTI (urinary tract infection)    Family History  Problem Relation Age of Onset   Colon polyps Mother    Hypertension Mother    Hypertension Father    Stroke Brother    Colon cancer Neg Hx    Past Surgical History:  Procedure Laterality Date   DILATION AND CURETTAGE OF UTERUS  2009   ESOPHAGOGASTRODUODENOSCOPY  10/29/2013   Normal EGD   HIP ARTHROPLASTY Left    INTRAUTERINE DEVICE (IUD) INSERTION  02/2009   OTHER SURGICAL HISTORY     Ureteral Surgery    SHOULDER ARTHROSCOPY Right    TONSILLECTOMY     TOTAL HIP ARTHROPLASTY Left 07/11/2020   Procedure: LEFT TOTAL HIP ARTHROPLASTY ANTERIOR APPROACH;  Surgeon: Arnie Lao, MD;  Location: [facility];  Service: Orthopedics;  Laterality: Left;   Social History   Occupational History   Not on file  Tobacco Use   Smoking status: Former    Current packs/day: 0.00    Types: Cigarettes    Quit date: 2004    Years since quitting: 21.3   Smokeless tobacco: Never  Vaping Use   Vaping status: Never Used  Substance and Sexual Activity   Alcohol  use: Yes    Alcohol /week: 3.0 standard drinks of alcohol     Types: 3 Glasses of wine per week    Comment: weekly   Drug use: Never   Sexual activity: Yes

## 2023-11-14 ENCOUNTER — Other Ambulatory Visit: Payer: Self-pay

## 2023-11-15 ENCOUNTER — Telehealth: Admitting: Physician Assistant

## 2023-11-15 DIAGNOSIS — J019 Acute sinusitis, unspecified: Secondary | ICD-10-CM

## 2023-11-15 DIAGNOSIS — B9689 Other specified bacterial agents as the cause of diseases classified elsewhere: Secondary | ICD-10-CM

## 2023-11-15 MED ORDER — DOXYCYCLINE HYCLATE 100 MG PO TABS
100.0000 mg | ORAL_TABLET | Freq: Two times a day (BID) | ORAL | 0 refills | Status: DC
  Filled 2023-11-15: qty 20, 10d supply, fill #0

## 2023-11-15 NOTE — Progress Notes (Signed)

## 2023-11-16 ENCOUNTER — Other Ambulatory Visit (HOSPITAL_BASED_OUTPATIENT_CLINIC_OR_DEPARTMENT_OTHER): Payer: Self-pay

## 2023-11-17 ENCOUNTER — Ambulatory Visit: Admitting: Sports Medicine

## 2023-11-17 ENCOUNTER — Encounter: Payer: Self-pay | Admitting: Sports Medicine

## 2023-11-17 ENCOUNTER — Telehealth: Payer: Self-pay | Admitting: Sports Medicine

## 2023-11-17 ENCOUNTER — Other Ambulatory Visit (HOSPITAL_BASED_OUTPATIENT_CLINIC_OR_DEPARTMENT_OTHER): Payer: Self-pay

## 2023-11-17 DIAGNOSIS — G8929 Other chronic pain: Secondary | ICD-10-CM | POA: Diagnosis not present

## 2023-11-17 DIAGNOSIS — M25551 Pain in right hip: Secondary | ICD-10-CM | POA: Diagnosis not present

## 2023-11-17 MED ORDER — NITROGLYCERIN 0.2 MG/HR TD PT24
MEDICATED_PATCH | TRANSDERMAL | 0 refills | Status: DC
  Filled 2023-11-17: qty 30, 30d supply, fill #0
  Filled 2023-11-17: qty 22, 88d supply, fill #0

## 2023-11-17 NOTE — Progress Notes (Signed)
 Alice Klein - 48 y.o. female MRN [id number]  Date of birth: 14-Feb-1976  Office Visit Note: Visit Date: 11/17/2023 PCP: Harvest Lineman, MD Referred by: Harvest Lineman, MD  Subjective: Chief Complaint  Patient presents with   Right Hip - Follow-up   HPI: Alice Klein Doi is a pleasant 48 y.o. female who presents today for follow-up of right lateral hip pain.  In total, we have performed 6 total treatments of extracorporeal shockwave therapy for the lateral hip.  She continues to find benefit from this, but has soreness after being on her feet a lot. She is on a home exercise regimen which she is doing somewhat consistently. Has used celebrex  PRN in past but not significant benefit so not using medication currently.   Pertinent ROS were reviewed with the patient and found to be negative unless otherwise specified above in HPI.   Assessment & Plan: Visit Diagnoses:  1. Greater trochanteric pain syndrome of right lower extremity   2. Chronic right hip pain    Plan: Impression is chronic right lateral hip pain with undersurface wear and tear and insufficiency of the gluteus tendons, more so the gluteus minimus.  She has made quite significant improvement after a number of extracorporeal shockwave treatment therapies as well as continuing her home rehab.  She still has not yet 100% however.  We did complete our last treatment of ESWT today.  To help further blood flow augmentation and tendon healing, we will start her on a nitroglycerin patch protocol, may apply one fourth patch and changed every 24 hours.  We would do this for 8 to 12 weeks.  We discussed taking a holiday from shockwave therapy and letting her continue her HEP.  In about 6 weeks she will update me how she is doing.  If she is significantly better she will continue, if she is not, we discussed possibly considering PRP injection therapy.  Follow-up: Return for notify me in 4-6 weeks of improvement .   Meds &  Orders:  Meds ordered this encounter  Medications   nitroGLYCERIN (NITRODUR - DOSED IN MG/24 HR) 0.2 mg/hr patch    Sig: Cut patch into fourths. Place 1/4 patch over affected area on hip. Change patch every 24 hours.    Dispense:  22 patch    Refill:  0   No orders of the defined types were placed in this encounter.    Procedures: Procedure: ECSWT Indications: Greater trochanteric pain syndrome/gluteal tendinopathy   Procedure Details Consent: Risks of procedure as well as the alternatives and risks of each were explained to the patient.  Verbal consent for procedure obtained. Time Out: Verified patient identification, verified procedure, site was marked, verified correct patient position. The area was cleaned with alcohol  swab.     The right greater trochanteric region and gluteus medius/minimus was targeted for Extracorporeal shockwave therapy.    Preset: Trochanteric bursitis Power Level: 120 mJ  Frequency: 14 Hz Impulse/cycles: 4000 Head size: Regular   Patient tolerated procedure well without immediate complications.       Clinical History: No specialty comments available.  She reports that she quit smoking about 21 years ago. Her smoking use included cigarettes. She has never used smokeless tobacco. No results for input(s): "HGBA1C", "LABURIC" in the last 8760 hours.  Objective:    Physical Exam  Gen: Well-appearing, in no acute distress; non-toxic CV: Well-perfused. Warm.  Resp: Breathing unlabored on room air; no wheezing. Psych: Fluid speech in conversation; appropriate affect; normal thought  process  Ortho Exam - Right lateral hip: There is some mild TTP over the anterior facet of the greater trochanter.  No redness or swelling.  Three-phase lateral abduction testing strength tested which is near equivocal to the contralateral side.  When activating the gluteus minimus more so there is mild discomfort with associated weakness.  Imaging: No results found.  Past  Medical/Family/Surgical/Social History: Medications & Allergies reviewed per EMR, new medications updated. Patient Active Problem List   Diagnosis Date Noted   Closed right ankle fracture 05/14/2022   Status post hip replacement 07/11/2020   Unilateral primary osteoarthritis, left hip 07/10/2020   Palpitations 08/23/2019   Past Medical History:  Diagnosis Date   Allergies    Anxiety    Chronic kidney disease    uritral re-implantation   GERD (gastroesophageal reflux disease)    Headache    Palpitations 08/23/2019   Pneumonia    Reactive airway disease    Seronegative rheumatoid arthritis (HCC)    SVT (supraventricular tachycardia) (HCC)    UTI (urinary tract infection)    Family History  Problem Relation Age of Onset   Colon polyps Mother    Hypertension Mother    Hypertension Father    Stroke Brother    Colon cancer Neg Hx    Past Surgical History:  Procedure Laterality Date   DILATION AND CURETTAGE OF UTERUS  2009   ESOPHAGOGASTRODUODENOSCOPY  10/29/2013   Normal EGD   HIP ARTHROPLASTY Left    INTRAUTERINE DEVICE (IUD) INSERTION  02/2009   OTHER SURGICAL HISTORY     Ureteral Surgery    SHOULDER ARTHROSCOPY Right    TONSILLECTOMY     TOTAL HIP ARTHROPLASTY Left 07/11/2020   Procedure: LEFT TOTAL HIP ARTHROPLASTY ANTERIOR APPROACH;  Surgeon: Arnie Lao, MD;  Location: [facility];  Service: Orthopedics;  Laterality: Left;   Social History   Occupational History   Not on file  Tobacco Use   Smoking status: Former    Current packs/day: 0.00    Types: Cigarettes    Quit date: 2004    Years since quitting: 21.3   Smokeless tobacco: Never  Vaping Use   Vaping status: Never Used  Substance and Sexual Activity   Alcohol  use: Yes    Alcohol /week: 3.0 standard drinks of alcohol     Types: 3 Glasses of wine per week    Comment: weekly   Drug use: Never   Sexual activity: Yes

## 2023-11-18 ENCOUNTER — Ambulatory Visit: Admitting: Sports Medicine

## 2023-11-18 ENCOUNTER — Other Ambulatory Visit (HOSPITAL_BASED_OUTPATIENT_CLINIC_OR_DEPARTMENT_OTHER): Payer: Self-pay

## 2023-11-25 ENCOUNTER — Ambulatory Visit: Admitting: Sports Medicine

## 2023-12-06 ENCOUNTER — Other Ambulatory Visit (INDEPENDENT_AMBULATORY_CARE_PROVIDER_SITE_OTHER): Payer: Self-pay

## 2023-12-06 ENCOUNTER — Ambulatory Visit: Admitting: Physician Assistant

## 2023-12-06 DIAGNOSIS — G8929 Other chronic pain: Secondary | ICD-10-CM | POA: Diagnosis not present

## 2023-12-06 DIAGNOSIS — M25512 Pain in left shoulder: Secondary | ICD-10-CM

## 2023-12-06 MED ORDER — METHYLPREDNISOLONE ACETATE 40 MG/ML IJ SUSP
40.0000 mg | INTRAMUSCULAR | Status: AC | PRN
Start: 2023-12-06 — End: 2023-12-06
  Administered 2023-12-06: 40 mg via INTRA_ARTICULAR

## 2023-12-06 MED ORDER — LIDOCAINE HCL 1 % IJ SOLN
1.0000 mL | INTRAMUSCULAR | Status: AC | PRN
  Administered 2023-12-06: 1 mL

## 2023-12-06 MED ORDER — LIDOCAINE HCL 1 % IJ SOLN
3.0000 mL | INTRAMUSCULAR | Status: AC | PRN
Start: 2023-12-06 — End: 2023-12-06
  Administered 2023-12-06: 3 mL

## 2023-12-06 MED ORDER — METHYLPREDNISOLONE ACETATE 40 MG/ML IJ SUSP
40.0000 mg | INTRAMUSCULAR | Status: AC | PRN
  Administered 2023-12-06: 40 mg via INTRA_ARTICULAR

## 2023-12-06 NOTE — Progress Notes (Signed)
 Office Visit Note   Patient: Alice Klein           Date of Birth: [birth year]           MRN: [id number] Visit Date: 12/06/2023              Requested by: Harvest Lineman, MD [address] [place],  [place] [address] PCP: Harvest Lineman, MD   Assessment & Plan: Visit Diagnoses:  1. Chronic left shoulder pain     Plan: Work on forward flexion exercises of the shoulder as shown.  Avoid push-ups pull-ups military press and bench press.  She will call us  in 2 weeks if her pain persist or becomes worse.  At that time would recommend MRI to rule out internal derangement of the left shoulder.  Follow-Up Instructions: Return if symptoms worsen or fail to improve.   Orders:  Orders Placed This Encounter  Procedures   Large Joint Inj   Medium Joint Inj   XR Shoulder Left   No orders of the defined types were placed in this encounter.     Procedures: Large Joint Inj: L subacromial bursa on 12/06/2023 7:39 PM Indications: pain Details: 22 G 1.5 in needle, superolateral approach  Arthrogram: No  Medications: 3 mL lidocaine  1 %; 40 mg methylPREDNISolone  acetate 40 MG/ML Outcome: tolerated well, no immediate complications Procedure, treatment alternatives, risks and benefits explained, specific risks discussed. Consent was given by the patient. Immediately prior to procedure a time out was called to verify the correct patient, procedure, equipment, support staff and site/side marked as required. Patient was prepped and draped in the usual sterile fashion.    Medium Joint Inj: L acromioclavicular on 12/06/2023 7:40 PM Details: anterior approach Medications: 1 mL lidocaine  1 %; 40 mg methylPREDNISolone  acetate 40 MG/ML Consent was given by the patient. Immediately prior to procedure a time out was called to verify the correct patient, procedure, equipment, support staff and site/side marked as required. Patient was prepped and draped in the usual sterile fashion.        Clinical Data: No additional findings.   Subjective: Chief Complaint  Patient presents with   Left Shoulder - Pain    HPI Alice Klein returns today for left shoulder pain has been ongoing for the past year.  Its become worse over the last month.  No known injury.  She notes decreased range of motion particularly reaching behind her and overhead.  Also across her chest causes increased pain.  She has discomfort when sleeping and occasionally shoulder pain does awaken her.  She denies any numbness tingling or radicular symptoms down either arm.  She has neck pain from tension but mostly this is occipital region on the right.  Describes her left shoulder pain as sharp stabbing pain.  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  She has tried Tylenol  ibuprofen without any real relief.  Review of Systems  Constitutional:  Negative for chills and fever.     Objective: Vital Signs: There were no vitals taken for this visit.  Physical Exam Constitutional:      Appearance: She is not ill-appearing or diaphoretic.  Pulmonary:     Effort: Pulmonary effort is normal.  Neurological:     Mental Status: She is alert and oriented to person, place, and time.  Psychiatric:        Mood and Affect: Mood normal.  Ortho Exam Bilateral shoulders: 5 out of 5 strength with external/internal rotation against resistance.  Liftoff test negative bilaterally.  Empty can test is negative bilaterally.  Impingement testing positive on the left only.  Crossover test positive on the left negative on the right.  Point tenderness over the left [facility] joint.  Biceps triceps strength 5 out of 5 bilaterally.  Nontender along medial borders of scapula bilaterally. Specialty Comments:  No specialty comments available.  Imaging: XR Shoulder Left Result Date: 12/06/2023 Left shoulder 3 views: Shoulder is well located.  Subacromial space  well-maintained.  Mild to moderate AC joint arthritic changes.  Glenohumeral joint is well-maintained.  No acute fractures acute findings or bony lesions.    PMFS History: Patient Active Problem List   Diagnosis Date Noted   Closed right ankle fracture 05/14/2022   Status post hip replacement 07/11/2020   Unilateral primary osteoarthritis, left hip 07/10/2020   Palpitations 08/23/2019   Past Medical History:  Diagnosis Date   Allergies    Anxiety    Chronic kidney disease    uritral re-implantation   GERD (gastroesophageal reflux disease)    Headache    Palpitations 08/23/2019   Pneumonia    Reactive airway disease    Seronegative rheumatoid arthritis (HCC)    SVT (supraventricular tachycardia) (HCC)    UTI (urinary tract infection)     Family History  Problem Relation Age of Onset   Colon polyps Mother    Hypertension Mother    Hypertension Father    Stroke Brother    Colon cancer Neg Hx     Past Surgical History:  Procedure Laterality Date   DILATION AND CURETTAGE OF UTERUS  2009   ESOPHAGOGASTRODUODENOSCOPY  10/29/2013   Normal EGD   HIP ARTHROPLASTY Left    INTRAUTERINE DEVICE (IUD) INSERTION  02/2009   OTHER SURGICAL HISTORY     Ureteral Surgery    SHOULDER ARTHROSCOPY Right    TONSILLECTOMY     TOTAL HIP ARTHROPLASTY Left 07/11/2020   Procedure: LEFT TOTAL HIP ARTHROPLASTY ANTERIOR APPROACH;  Surgeon: Arnie Lao, MD;  Location: [facility];  Service: Orthopedics;  Laterality: Left;   Social History   Occupational History   Not on file  Tobacco Use   Smoking status: Former    Current packs/day: 0.00    Types: Cigarettes    Quit date: 2004    Years since quitting: 21.4   Smokeless tobacco: Never  Vaping Use   Vaping status: Never Used  Substance and Sexual Activity   Alcohol  use: Yes    Alcohol /week: 3.0 standard drinks of alcohol     Types: 3 Glasses of wine per week    Comment: weekly   Drug use: Never   Sexual activity: Yes

## 2023-12-12 ENCOUNTER — Other Ambulatory Visit (HOSPITAL_BASED_OUTPATIENT_CLINIC_OR_DEPARTMENT_OTHER): Payer: Self-pay

## 2023-12-12 DIAGNOSIS — Z01419 Encounter for gynecological examination (general) (routine) without abnormal findings: Secondary | ICD-10-CM | POA: Diagnosis not present

## 2023-12-12 DIAGNOSIS — A6000 Herpesviral infection of urogenital system, unspecified: Secondary | ICD-10-CM | POA: Diagnosis not present

## 2023-12-12 DIAGNOSIS — Z6823 Body mass index (BMI) 23.0-23.9, adult: Secondary | ICD-10-CM | POA: Diagnosis not present

## 2023-12-12 MED ORDER — VALACYCLOVIR HCL 1 G PO TABS
1000.0000 mg | ORAL_TABLET | Freq: Two times a day (BID) | ORAL | 1 refills | Status: AC
  Filled 2023-12-12: qty 60, 30d supply, fill #0
  Filled 2024-04-12: qty 60, 30d supply, fill #1

## 2023-12-17 DIAGNOSIS — Z1211 Encounter for screening for malignant neoplasm of colon: Secondary | ICD-10-CM | POA: Diagnosis not present

## 2023-12-17 DIAGNOSIS — Z1212 Encounter for screening for malignant neoplasm of rectum: Secondary | ICD-10-CM | POA: Diagnosis not present

## 2023-12-22 ENCOUNTER — Other Ambulatory Visit: Payer: Self-pay

## 2023-12-22 NOTE — Telephone Encounter (Signed)
 Scheduled

## 2023-12-26 ENCOUNTER — Other Ambulatory Visit: Payer: Self-pay

## 2023-12-26 NOTE — Progress Notes (Signed)
 Patient has stopped therapy, last filled in February 2025.  Disenrolling from Specialty Pharmacy services at this time.

## 2024-01-09 ENCOUNTER — Encounter: Payer: Self-pay | Admitting: Orthopaedic Surgery

## 2024-01-09 ENCOUNTER — Ambulatory Visit: Admitting: Orthopaedic Surgery

## 2024-01-09 DIAGNOSIS — G8929 Other chronic pain: Secondary | ICD-10-CM | POA: Diagnosis not present

## 2024-01-09 DIAGNOSIS — M25512 Pain in left shoulder: Secondary | ICD-10-CM | POA: Diagnosis not present

## 2024-01-09 DIAGNOSIS — M7542 Impingement syndrome of left shoulder: Secondary | ICD-10-CM | POA: Diagnosis not present

## 2024-01-09 NOTE — Progress Notes (Signed)
 It has been a month since we have seen the patient last.  She has been dealing with left shoulder pain and issues for a while now.  A month ago she did have a steroid injection in her left shoulder subacromial outlet and around the [facility] joint.  She says she is 40 to 50% better since the injection but reaching behind her is still painful and stiff and overhead she still feels some popping sensation.  It is painful if she props her arm on something.  A lot of this seems to be impingement the way she is describing things and based on physical exam she is definitely still showing some signs of impingement.    On exam her shoulder shows full forward flexion and full abduction.  Reaching behind her still difficult and she reaches to only the lower lumbar spine level with the right side.  Her shoulder is well located.  There is still some popping sensation that I can elicit when we get her past 90 degrees of abduction and forward flexion.  At this point I would like to send her to physical therapy for any modalities that can help decrease her left shoulder pain and improve the shoulder range of motion.  We will then see her back in 4 weeks and would consider repeat subacromial steroid injection at that visit if needed.  She agrees with the treatment plan.

## 2024-02-08 ENCOUNTER — Ambulatory Visit: Admitting: Orthopaedic Surgery

## 2024-02-13 ENCOUNTER — Other Ambulatory Visit (HOSPITAL_COMMUNITY): Payer: Self-pay

## 2024-02-15 ENCOUNTER — Telehealth: Admitting: Physician Assistant

## 2024-02-15 DIAGNOSIS — R3989 Other symptoms and signs involving the genitourinary system: Secondary | ICD-10-CM

## 2024-02-16 ENCOUNTER — Other Ambulatory Visit (HOSPITAL_BASED_OUTPATIENT_CLINIC_OR_DEPARTMENT_OTHER): Payer: Self-pay

## 2024-02-16 MED ORDER — NITROFURANTOIN MONOHYD MACRO 100 MG PO CAPS
100.0000 mg | ORAL_CAPSULE | Freq: Two times a day (BID) | ORAL | 0 refills | Status: DC
  Filled 2024-02-16: qty 10, 5d supply, fill #0

## 2024-02-16 NOTE — Progress Notes (Signed)
 I have spent 5 minutes in review of e-visit questionnaire, review and updating patient chart, medical decision making and response to patient.   Elsie Velma Lunger, PA-C

## 2024-02-16 NOTE — Progress Notes (Signed)

## 2024-03-22 ENCOUNTER — Other Ambulatory Visit (HOSPITAL_BASED_OUTPATIENT_CLINIC_OR_DEPARTMENT_OTHER): Payer: Self-pay

## 2024-03-22 DIAGNOSIS — Z6824 Body mass index (BMI) 24.0-24.9, adult: Secondary | ICD-10-CM | POA: Diagnosis not present

## 2024-03-22 DIAGNOSIS — N3000 Acute cystitis without hematuria: Secondary | ICD-10-CM | POA: Diagnosis not present

## 2024-03-22 MED ORDER — NITROFURANTOIN MONOHYD MACRO 100 MG PO CAPS
100.0000 mg | ORAL_CAPSULE | Freq: Two times a day (BID) | ORAL | 0 refills | Status: DC
  Filled 2024-03-22: qty 14, 7d supply, fill #0

## 2024-03-26 ENCOUNTER — Other Ambulatory Visit (HOSPITAL_BASED_OUTPATIENT_CLINIC_OR_DEPARTMENT_OTHER): Payer: Self-pay

## 2024-03-26 MED ORDER — NITROFURANTOIN MONOHYD MACRO 100 MG PO CAPS
100.0000 mg | ORAL_CAPSULE | Freq: Two times a day (BID) | ORAL | 0 refills | Status: AC
  Filled 2024-03-28: qty 20, 10d supply, fill #0

## 2024-03-28 ENCOUNTER — Other Ambulatory Visit (HOSPITAL_BASED_OUTPATIENT_CLINIC_OR_DEPARTMENT_OTHER): Payer: Self-pay

## 2024-04-07 ENCOUNTER — Other Ambulatory Visit (HOSPITAL_COMMUNITY): Payer: Self-pay

## 2024-04-09 ENCOUNTER — Other Ambulatory Visit (HOSPITAL_BASED_OUTPATIENT_CLINIC_OR_DEPARTMENT_OTHER): Payer: Self-pay

## 2024-04-09 MED ORDER — ALBUTEROL SULFATE HFA 108 (90 BASE) MCG/ACT IN AERS
2.0000 | INHALATION_SPRAY | RESPIRATORY_TRACT | 3 refills | Status: AC | PRN
  Filled 2024-04-09: qty 6.7, 25d supply, fill #0

## 2024-04-12 ENCOUNTER — Other Ambulatory Visit (HOSPITAL_COMMUNITY): Payer: Self-pay

## 2024-04-12 ENCOUNTER — Other Ambulatory Visit: Payer: Self-pay

## 2024-04-12 ENCOUNTER — Other Ambulatory Visit (HOSPITAL_BASED_OUTPATIENT_CLINIC_OR_DEPARTMENT_OTHER): Payer: Self-pay

## 2024-04-12 MED ORDER — ALPRAZOLAM 0.5 MG PO TABS
0.5000 mg | ORAL_TABLET | Freq: Two times a day (BID) | ORAL | 3 refills | Status: AC
  Filled 2024-04-12: qty 60, 30d supply, fill #0

## 2024-04-20 DIAGNOSIS — M0600 Rheumatoid arthritis without rheumatoid factor, unspecified site: Secondary | ICD-10-CM | POA: Diagnosis not present

## 2024-04-20 DIAGNOSIS — Z6824 Body mass index (BMI) 24.0-24.9, adult: Secondary | ICD-10-CM | POA: Diagnosis not present

## 2024-04-20 DIAGNOSIS — M1991 Primary osteoarthritis, unspecified site: Secondary | ICD-10-CM | POA: Diagnosis not present

## 2024-04-20 DIAGNOSIS — Z79899 Other long term (current) drug therapy: Secondary | ICD-10-CM | POA: Diagnosis not present

## 2024-05-03 ENCOUNTER — Other Ambulatory Visit (HOSPITAL_COMMUNITY): Payer: Self-pay

## 2024-05-06 ENCOUNTER — Other Ambulatory Visit (HOSPITAL_COMMUNITY): Payer: Self-pay

## 2024-05-07 ENCOUNTER — Other Ambulatory Visit (HOSPITAL_BASED_OUTPATIENT_CLINIC_OR_DEPARTMENT_OTHER): Payer: Self-pay

## 2024-05-07 ENCOUNTER — Encounter: Payer: Self-pay | Admitting: Radiology

## 2024-05-07 MED ORDER — MONTELUKAST SODIUM 10 MG PO TABS
10.0000 mg | ORAL_TABLET | Freq: Every day | ORAL | 1 refills | Status: AC
  Filled 2024-05-07: qty 90, 90d supply, fill #0
  Filled 2024-08-02: qty 90, 90d supply, fill #1

## 2024-05-16 ENCOUNTER — Other Ambulatory Visit (HOSPITAL_BASED_OUTPATIENT_CLINIC_OR_DEPARTMENT_OTHER): Payer: Self-pay

## 2024-05-16 DIAGNOSIS — N3091 Cystitis, unspecified with hematuria: Secondary | ICD-10-CM | POA: Diagnosis not present

## 2024-05-16 MED ORDER — NITROFURANTOIN MONOHYD MACRO 100 MG PO CAPS
100.0000 mg | ORAL_CAPSULE | Freq: Two times a day (BID) | ORAL | 0 refills | Status: AC
  Filled 2024-05-16: qty 20, 10d supply, fill #0

## 2024-05-16 MED ORDER — PHENAZOPYRIDINE HCL 200 MG PO TABS
200.0000 mg | ORAL_TABLET | Freq: Three times a day (TID) | ORAL | 0 refills | Status: AC | PRN
  Filled 2024-05-16: qty 6, 2d supply, fill #0

## 2024-06-13 ENCOUNTER — Encounter: Payer: Self-pay | Admitting: Physician Assistant

## 2024-06-13 ENCOUNTER — Ambulatory Visit: Admitting: Physician Assistant

## 2024-06-13 DIAGNOSIS — G8929 Other chronic pain: Secondary | ICD-10-CM | POA: Diagnosis not present

## 2024-06-13 DIAGNOSIS — M25512 Pain in left shoulder: Secondary | ICD-10-CM | POA: Diagnosis not present

## 2024-06-13 DIAGNOSIS — M7542 Impingement syndrome of left shoulder: Secondary | ICD-10-CM

## 2024-06-13 MED ORDER — METHYLPREDNISOLONE ACETATE 40 MG/ML IJ SUSP
40.0000 mg | INTRAMUSCULAR | Status: AC | PRN
  Administered 2024-06-13: 40 mg via INTRA_ARTICULAR

## 2024-06-13 MED ORDER — LIDOCAINE HCL 1 % IJ SOLN
3.0000 mL | INTRAMUSCULAR | Status: AC | PRN
  Administered 2024-06-13: 3 mL

## 2024-06-13 NOTE — Progress Notes (Signed)
 HPI: Lillieann returns today due to increasing left shoulder pain.  Shoulder pains been ongoing for over a year.  She has had no known injury.  She notes continued decreased range of motion and external rotation.  Shoulder pain is awakening her.  She has had cortisone injections with minimal relief.  Last summer she was performing home exercise program on her own.  She has some neck stiffness and occasional this tingling 3rd through 5th fingers.  But her main complaint is deep shoulder pain.  She states she is taking a significant amount of Tylenol  and ibuprofen just to function.  Review of systems: Negative for fever or chills.  Physical exam: General Well-developed well-nourished female in no acute distress. Cervical spine: Good range of motion of cervical spine without pain.  She is nontender over the medial borders of bilateral scapula.  Nontender over the trapezius region bilateral shoulders. Bilateral shoulders : 5 out of 5 strength with external and internal rotation against resistance.  Empty can test is negative bilaterally but causes pain on the left.  Limited passive and active able to bring her to 120 degrees compared to full range of motion of the right shoulder overhead without pain.  Impingement testing positive on the left negative on the right.  Unable to perform liftoff test secondary to pain.  Impression: Left shoulder impingement  Plan: Continue to deal with the acute pain provided with a another subacromial injection.  In regards to the now chronic shoulder pain and the decreased range of motion despite conservative measures.  Recommend MRI to rule out internal derangement /cuff tear.  She will continue her home exercise program for her shoulder particularly overhead motion to prevent frozen shoulder which she is developing.      Procedure Note  Patient: Kimberlly Norgard Lievanos             Date of Birth: [birth year]           MRN: [id number]             Visit Date:  06/13/2024  Procedures: Visit Diagnoses:  1. Impingement syndrome of left shoulder   2. Chronic left shoulder pain     Large Joint Inj: L subacromial bursa on 06/13/2024 8:39 AM Indications: pain Details: 22 G 1.5 in needle, superior approach  Arthrogram: No  Medications: 3 mL lidocaine  1 %; 40 mg methylPREDNISolone  acetate 40 MG/ML Outcome: tolerated well, no immediate complications Procedure, treatment alternatives, risks and benefits explained, specific risks discussed. Consent was given by the patient. Immediately prior to procedure a time out was called to verify the correct patient, procedure, equipment, support staff and site/side marked as required. Patient was prepped and draped in the usual sterile fashion.

## 2024-07-13 ENCOUNTER — Other Ambulatory Visit

## 2024-07-16 ENCOUNTER — Encounter: Payer: Self-pay | Admitting: Physician Assistant

## 2024-07-24 ENCOUNTER — Ambulatory Visit
Admission: RE | Admit: 2024-07-24 | Discharge: 2024-07-24 | Disposition: A | Source: Ambulatory Visit | Attending: Physician Assistant | Admitting: Physician Assistant

## 2024-07-24 DIAGNOSIS — G8929 Other chronic pain: Secondary | ICD-10-CM

## 2024-07-24 DIAGNOSIS — M7542 Impingement syndrome of left shoulder: Secondary | ICD-10-CM

## 2024-07-25 ENCOUNTER — Ambulatory Visit: Admitting: Physician Assistant

## 2024-08-02 ENCOUNTER — Other Ambulatory Visit: Payer: Self-pay

## 2024-08-02 ENCOUNTER — Other Ambulatory Visit (HOSPITAL_COMMUNITY): Payer: Self-pay

## 2024-08-03 ENCOUNTER — Encounter: Payer: Self-pay | Admitting: Orthopedic Surgery

## 2024-08-03 ENCOUNTER — Other Ambulatory Visit: Payer: Self-pay

## 2024-08-03 ENCOUNTER — Ambulatory Visit: Admitting: Orthopedic Surgery

## 2024-08-03 DIAGNOSIS — G8929 Other chronic pain: Secondary | ICD-10-CM | POA: Diagnosis not present

## 2024-08-03 DIAGNOSIS — M7502 Adhesive capsulitis of left shoulder: Secondary | ICD-10-CM | POA: Diagnosis not present

## 2024-08-03 DIAGNOSIS — M25512 Pain in left shoulder: Secondary | ICD-10-CM | POA: Diagnosis not present

## 2024-08-03 DIAGNOSIS — M7542 Impingement syndrome of left shoulder: Secondary | ICD-10-CM

## 2024-08-03 MED ORDER — BUPIVACAINE HCL 0.5 % IJ SOLN
9.0000 mL | INTRAMUSCULAR | Status: AC | PRN
  Administered 2024-08-03: 9 mL via INTRA_ARTICULAR

## 2024-08-03 MED ORDER — LIDOCAINE HCL 1 % IJ SOLN
5.0000 mL | INTRAMUSCULAR | Status: AC | PRN
  Administered 2024-08-03: 5 mL

## 2024-08-03 MED ORDER — TRIAMCINOLONE ACETONIDE 40 MG/ML IJ SUSP
40.0000 mg | INTRAMUSCULAR | Status: AC | PRN
  Administered 2024-08-03: 40 mg via INTRA_ARTICULAR

## 2024-08-03 NOTE — Progress Notes (Signed)
 "  Office Visit Note   Patient: Alice Klein           Date of Birth: [birth year]           MRN: [id number] Visit Date: 08/03/2024 Requested by: Clemmie Nest, MD [address] [place],  [place] [address] PCP: Clemmie Nest, MD  Subjective: Chief Complaint  Patient presents with   Left Shoulder - Pain    HPI: Alice Klein is a 49 y.o. female who presents to the office reporting left shoulder pain.  She has had an MRI scan of the left shoulder which is reviewed.  My review of the scan demonstrates the rotator cuff is intact.  She does have a little bit of tendinosis but no definite shoulder effusion or significant glenohumeral arthritis.  Inferior capsule is thickened consistent with adhesive capsulitis.  Patient describes symptoms in the left shoulder for well over a year.  Getting worse.  Hard to sleep on that left-hand side.  Denies much in the way of neck pain or radicular symptoms.  Has had 2 subacromial injections which helped some.  Does take ibuprofen.  Has a history of rheumatoid arthritis but has not taken any Biologics for about 9 months and has not had a significant increase in her joint pains..                ROS: All systems reviewed are negative as they relate to the chief complaint within the history of present illness.  Patient denies fevers or chills.  Assessment & Plan: Visit Diagnoses:  1. Impingement syndrome of left shoulder   2. Chronic left shoulder pain     Plan: Impression is left frozen shoulder.  Rotator cuff strength is intact but passive range of motion is diminished asymmetrically which is consistent with adhesive capsulitis.  Plan is glenohumeral joint injection under ultrasound with home exercise program.  6-week return for clinical recheck and decision for against another injection and/or formal physical therapy at that time.  Continue with daily anti-inflammatories consisting of Celebrex  once a day for at least 4 weeks.  Follow-Up  Instructions: No follow-ups on file.   Orders:  Orders Placed This Encounter  Procedures   US  Guided Needle Placement - No Linked Charges   No orders of the defined types were placed in this encounter.     Procedures: Large Joint Inj: L glenohumeral on 08/03/2024 1:55 PM Indications: diagnostic evaluation and pain Details: 22 G 3.5 in needle, ultrasound-guided posterior approach  Arthrogram: No  Medications: 9 mL bupivacaine  0.5 %; 5 mL lidocaine  1 %; 40 mg triamcinolone  acetonide 40 MG/ML Outcome: tolerated well, no immediate complications Procedure, treatment alternatives, risks and benefits explained, specific risks discussed. Consent was given by the patient. Immediately prior to procedure a time out was called to verify the correct patient, procedure, equipment, support staff and site/side marked as required. Patient was prepped and draped in the usual sterile fashion.       Clinical Data: No additional findings.  Objective: Vital Signs: There were no vitals taken for this visit.  Physical Exam:  Constitutional: Patient appears well-developed HEENT:  Head: Normocephalic Eyes:EOM are normal Neck: Normal range of motion Cardiovascular: Normal rate Pulmonary/chest: Effort normal Neurologic: Patient is alert Skin: Skin is warm Psychiatric: Patient has normal mood and affect  Ortho Exam: Ortho exam demonstrates range of motion on the right of 70/95/170 range of motion on the left 30/45/110.  Rotator cuff strength intact infraspinatus supraspinatus and subscap muscle testing  bilaterally.  No coarseness or grinding is present with internal/external rotation of that left shoulder at 45 to 50 degrees of abduction.  Specialty Comments:  No specialty comments available.  Imaging: US  Guided Needle Placement - No Linked Charges Result Date: 08/03/2024 Ultrasound imaging demonstrates needle placement into the glenohumeral joint with injection of fluid into the joint and no  complicating features. Left shoulder    PMFS History: Patient Active Problem List   Diagnosis Date Noted   Closed right ankle fracture 05/14/2022   Status post hip replacement 07/11/2020   Unilateral primary osteoarthritis, left hip 07/10/2020   Palpitations 08/23/2019   Past Medical History:  Diagnosis Date   Allergies    Anxiety    Chronic kidney disease    uritral re-implantation   GERD (gastroesophageal reflux disease)    Headache    Palpitations 08/23/2019   Pneumonia    Reactive airway disease    Seronegative rheumatoid arthritis (HCC)    SVT (supraventricular tachycardia)    UTI (urinary tract infection)     Family History  Problem Relation Age of Onset   Colon polyps Mother    Hypertension Mother    Hypertension Father    Stroke Brother    Colon cancer Neg Hx     Past Surgical History:  Procedure Laterality Date   DILATION AND CURETTAGE OF UTERUS  2009   ESOPHAGOGASTRODUODENOSCOPY  10/29/2013   Normal EGD   HIP ARTHROPLASTY Left    INTRAUTERINE DEVICE (IUD) INSERTION  02/2009   OTHER SURGICAL HISTORY     Ureteral Surgery    SHOULDER ARTHROSCOPY Right    TONSILLECTOMY     TOTAL HIP ARTHROPLASTY Left 07/11/2020   Procedure: LEFT TOTAL HIP ARTHROPLASTY ANTERIOR APPROACH;  Surgeon: Vernetta Lonni GRADE, MD;  Location: [facility];  Service: Orthopedics;  Laterality: Left;   Social History   Occupational History   Not on file  Tobacco Use   Smoking status: Former    Current packs/day: 0.00    Types: Cigarettes    Quit date: 2004    Years since quitting: 22.0   Smokeless tobacco: Never  Vaping Use   Vaping status: Never Used  Substance and Sexual Activity   Alcohol  use: Yes    Alcohol /week: 3.0 standard drinks of alcohol     Types: 3 Glasses of wine per week    Comment: weekly   Drug use: Never   Sexual activity: Yes        "

## 2024-08-06 ENCOUNTER — Ambulatory Visit: Admitting: Physician Assistant

## 2024-09-14 ENCOUNTER — Ambulatory Visit: Admitting: Orthopedic Surgery
